# Patient Record
Sex: Female | Born: 1985 | Race: White | Hispanic: Yes | Marital: Single | Smoking: Never smoker
Health system: Southern US, Community
[De-identification: ages and names within clinical notes are randomized; demographics above are authoritative.]

## PROBLEM LIST (undated history)

## (undated) ENCOUNTER — Emergency Department (HOSPITAL_COMMUNITY): Admission: EM | Payer: Self-pay

## (undated) DIAGNOSIS — Z789 Other specified health status: Secondary | ICD-10-CM

## (undated) HISTORY — PX: BREAST CYST EXCISION: SHX579

## (undated) HISTORY — DX: Other specified health status: Z78.9

---

## 2006-09-29 ENCOUNTER — Encounter: Payer: Self-pay | Admitting: Obstetrics and Gynecology

## 2006-09-29 ENCOUNTER — Ambulatory Visit: Payer: Self-pay | Admitting: Obstetrics & Gynecology

## 2006-10-05 ENCOUNTER — Ambulatory Visit (HOSPITAL_COMMUNITY): Admission: RE | Admit: 2006-10-05 | Discharge: 2006-10-05 | Payer: Self-pay | Admitting: Obstetrics and Gynecology

## 2006-10-13 ENCOUNTER — Ambulatory Visit: Payer: Self-pay | Admitting: Obstetrics & Gynecology

## 2006-10-20 ENCOUNTER — Ambulatory Visit: Payer: Self-pay | Admitting: Obstetrics & Gynecology

## 2006-10-25 ENCOUNTER — Ambulatory Visit: Payer: Self-pay | Admitting: Obstetrics & Gynecology

## 2006-10-27 ENCOUNTER — Ambulatory Visit (HOSPITAL_COMMUNITY): Admission: RE | Admit: 2006-10-27 | Discharge: 2006-10-27 | Payer: Self-pay | Admitting: Obstetrics and Gynecology

## 2006-10-27 ENCOUNTER — Ambulatory Visit: Payer: Self-pay | Admitting: *Deleted

## 2006-11-01 ENCOUNTER — Ambulatory Visit: Payer: Self-pay | Admitting: Obstetrics and Gynecology

## 2006-11-03 ENCOUNTER — Ambulatory Visit: Payer: Self-pay | Admitting: *Deleted

## 2006-11-10 ENCOUNTER — Ambulatory Visit: Payer: Self-pay | Admitting: Obstetrics & Gynecology

## 2006-11-11 ENCOUNTER — Ambulatory Visit (HOSPITAL_COMMUNITY): Admission: RE | Admit: 2006-11-11 | Discharge: 2006-11-11 | Payer: Self-pay | Admitting: Family Medicine

## 2006-11-17 ENCOUNTER — Ambulatory Visit: Payer: Self-pay | Admitting: Obstetrics & Gynecology

## 2006-11-24 ENCOUNTER — Ambulatory Visit: Payer: Self-pay | Admitting: Obstetrics & Gynecology

## 2006-12-01 ENCOUNTER — Ambulatory Visit: Payer: Self-pay | Admitting: Obstetrics & Gynecology

## 2006-12-04 ENCOUNTER — Ambulatory Visit: Payer: Self-pay | Admitting: Obstetrics & Gynecology

## 2006-12-04 ENCOUNTER — Inpatient Hospital Stay (HOSPITAL_COMMUNITY): Admission: AD | Admit: 2006-12-04 | Discharge: 2006-12-06 | Payer: Self-pay | Admitting: Obstetrics & Gynecology

## 2008-09-06 IMAGING — US US OB COMP +14 WK
1 series · 14 of 28 positions shown · non-contrast
Comparison: none

OBSTETRICAL ULTRASOUND:

 This ultrasound exam was performed in the [HOSPITAL] Ultrasound Department.  The OB US report was generated in the AS system, and faxed to the ordering physician.  This report is also available in [REDACTED] PACS.

[Series 1: us ob comp +14 wk · 0.26mm/px · 14 of 43 slices shown]
[im 2/43]
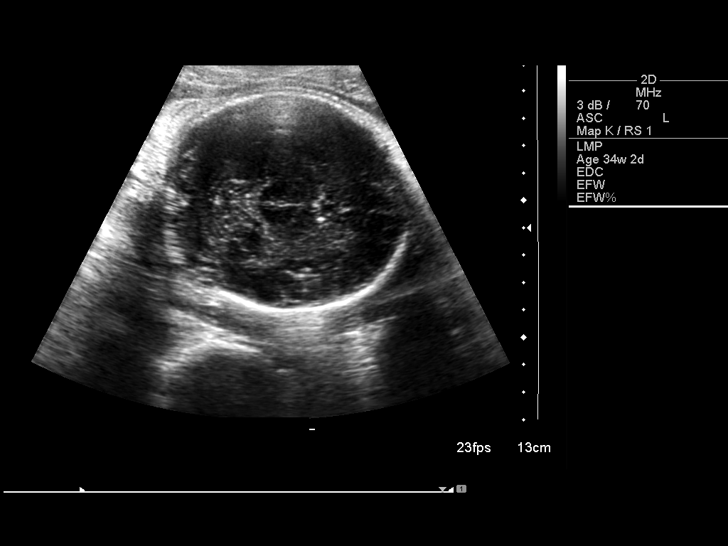
[im 5/43]
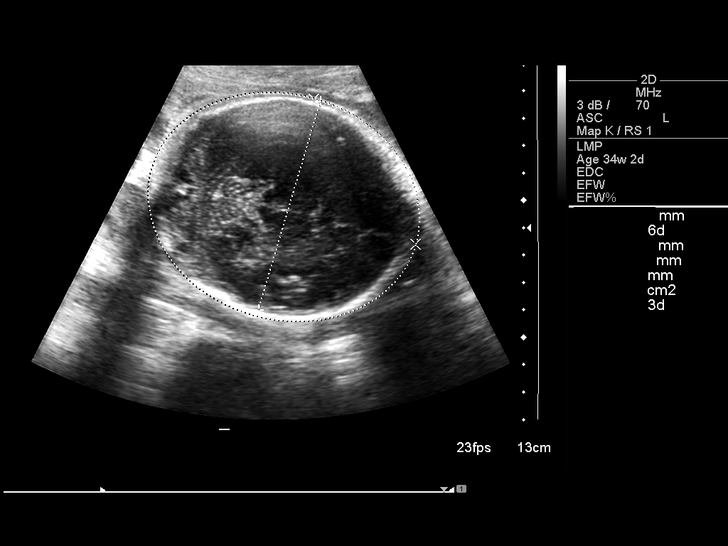
[im 8/43]
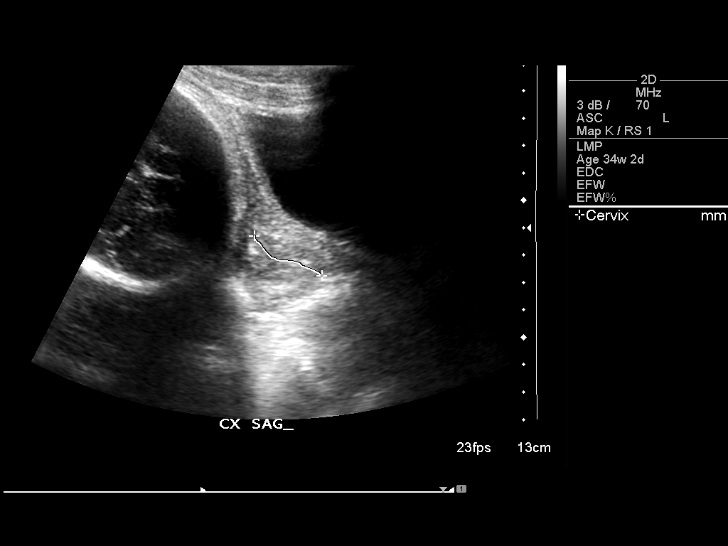
[im 11/43]
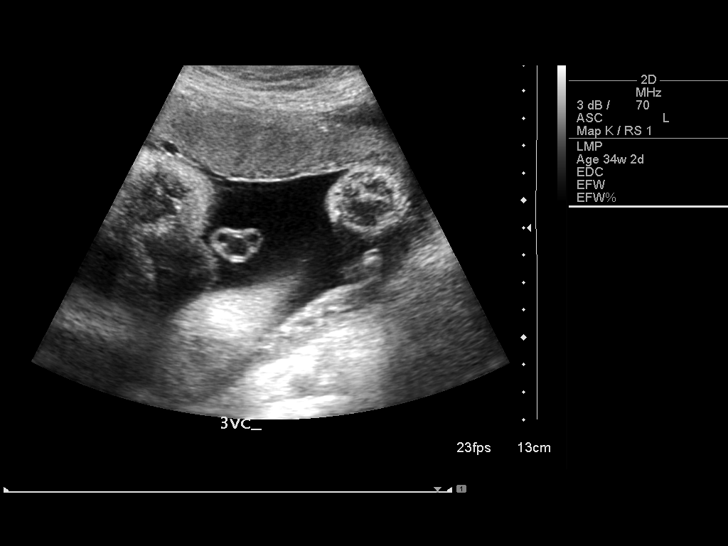
[im 15/43]
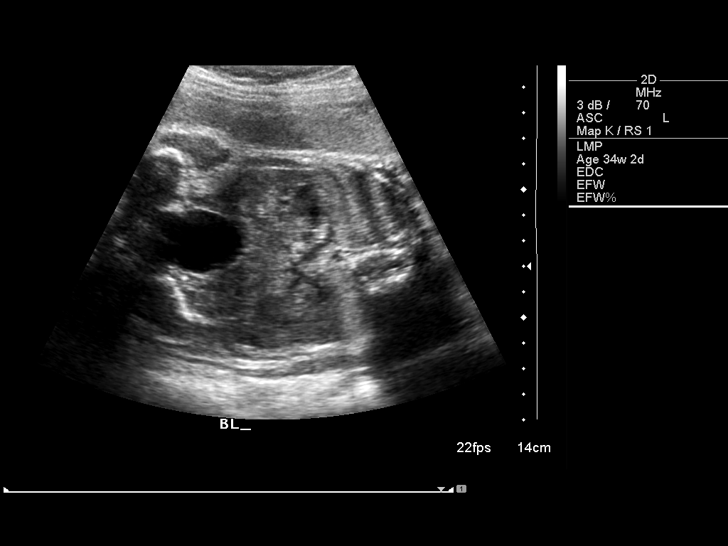
[im 18/43]
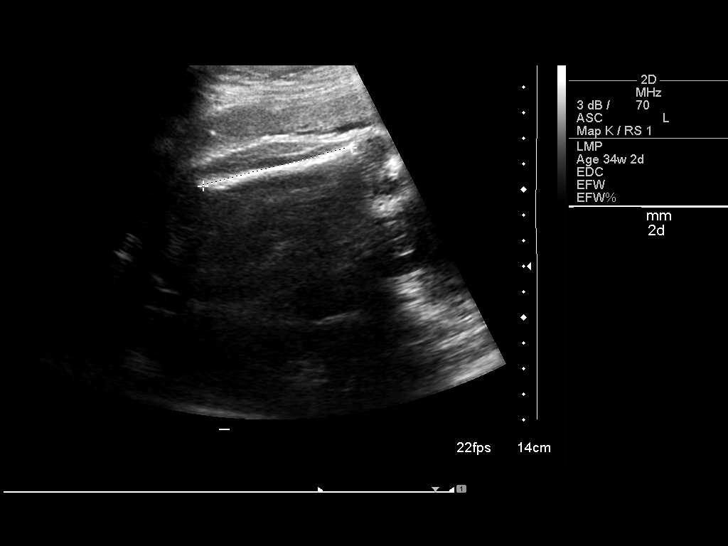
[im 21/43]
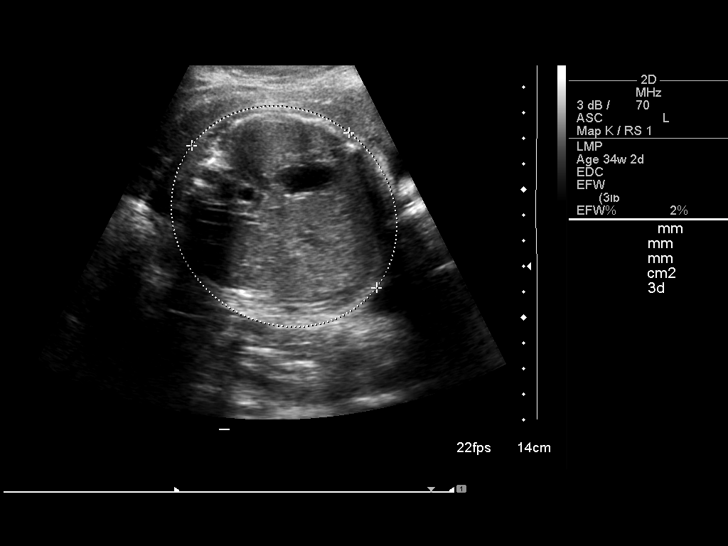
[im 24/43]
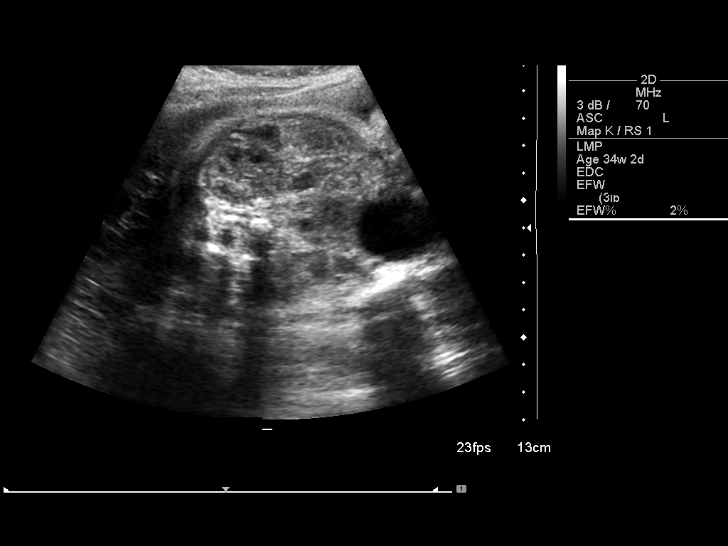
[im 27/43]
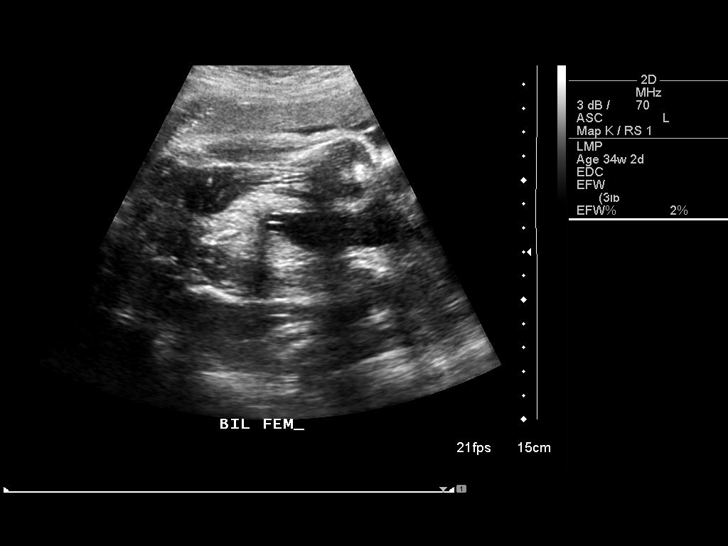
[im 30/43]
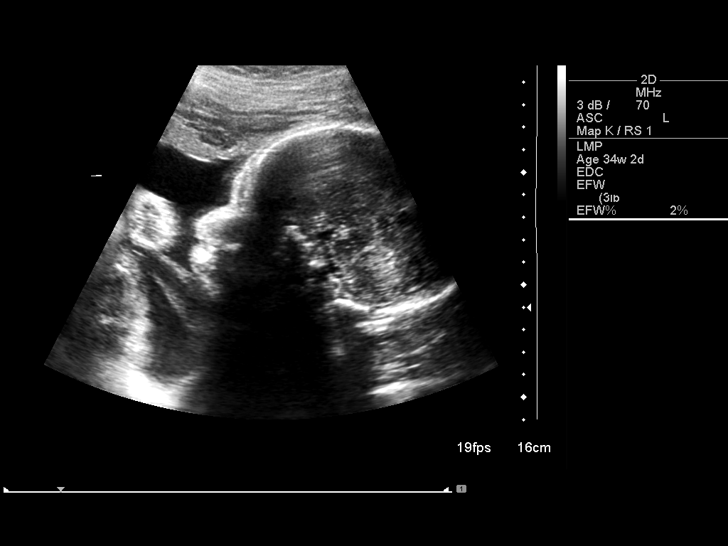
[im 33/43]
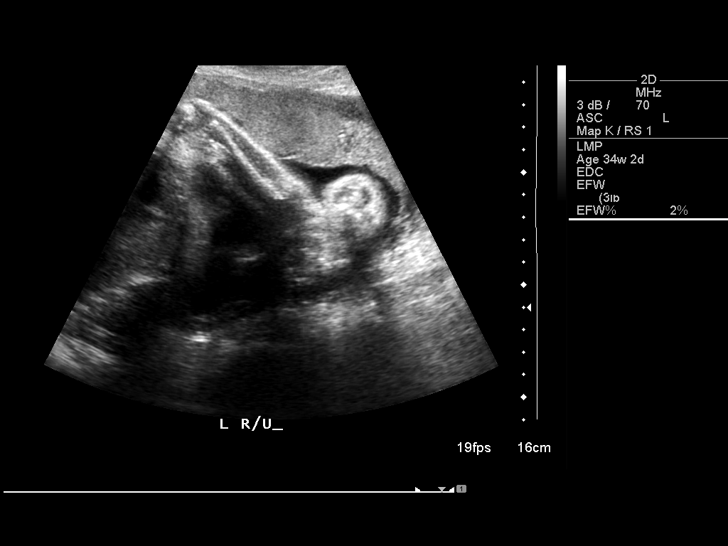
[im 36/43]
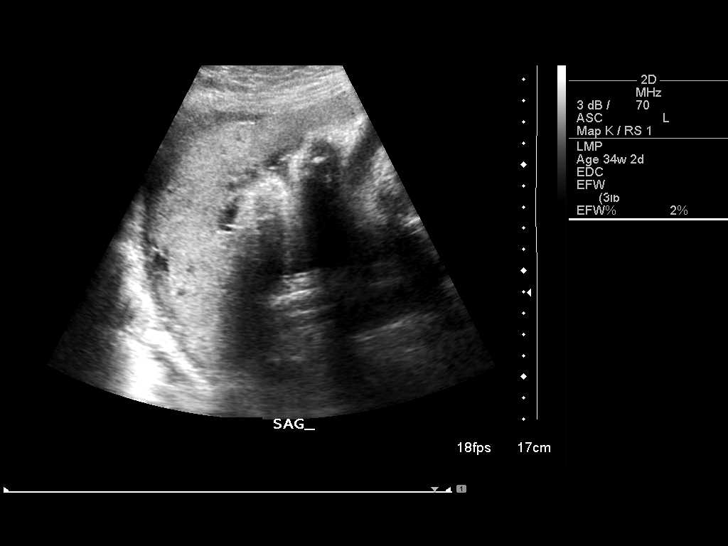
[im 39/43]
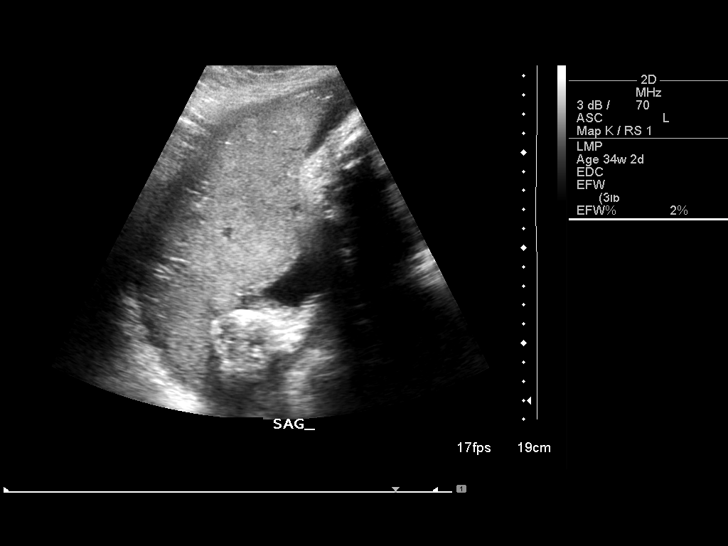
[im 43/43]
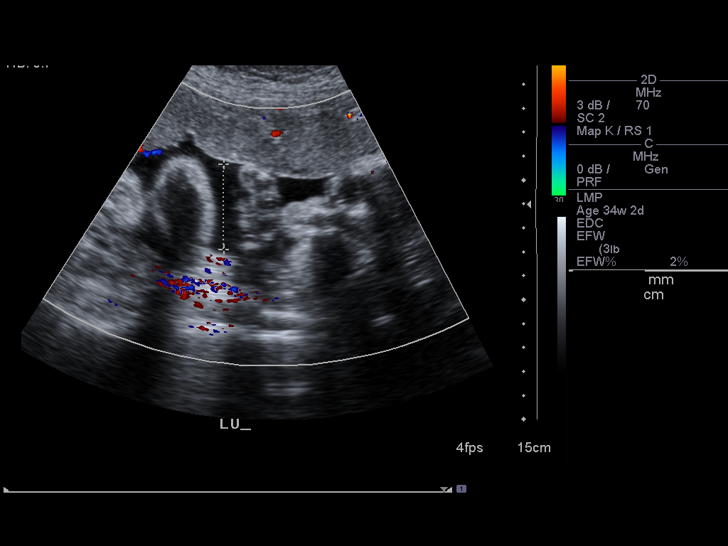

[14 of 28 positions shown; findings below may reference images not displayed]

IMPRESSION: See [facility] Obstetric US report.

## 2011-04-01 LAB — COMPREHENSIVE METABOLIC PANEL
ALT: 20
AST: 22
Albumin: 3.1 — ABNORMAL LOW
Alkaline Phosphatase: 204 — ABNORMAL HIGH
Alkaline Phosphatase: 274 — ABNORMAL HIGH
BUN: 11
CO2: 24
Calcium: 9.2
Chloride: 105
GFR calc Af Amer: >60
GFR calc non Af Amer: >60
Glucose, Bld: 107 — ABNORMAL HIGH
Potassium: 3.8
Potassium: 4
Sodium: 135
Total Protein: 7.1

## 2011-04-01 LAB — POCT URINALYSIS DIP (DEVICE)
Ketones, ur: NEGATIVE
Nitrite: NEGATIVE
Protein, ur: NEGATIVE
pH: 6

## 2011-04-01 LAB — CBC
HCT: 38
Hemoglobin: 10.8 — ABNORMAL LOW
MCHC: 34.2
MCHC: 34.5
Platelets: 188
RBC: 3.56 — ABNORMAL LOW
RDW: 14.9 — ABNORMAL HIGH
WBC: 15 — ABNORMAL HIGH

## 2011-04-01 LAB — RPR: RPR Ser Ql: NONREACTIVE

## 2011-04-01 LAB — LACTATE DEHYDROGENASE: LDH: 110

## 2011-04-01 LAB — URIC ACID: Uric Acid, Serum: 6.6

## 2011-04-02 LAB — POCT URINALYSIS DIP (DEVICE)
Bilirubin Urine: NEGATIVE
Glucose, UA: NEGATIVE
Hgb urine dipstick: NEGATIVE
Hgb urine dipstick: NEGATIVE
Nitrite: NEGATIVE
Nitrite: NEGATIVE
Specific Gravity, Urine: 1.02
Specific Gravity, Urine: 1.02
Urobilinogen, UA: 0.2
Urobilinogen, UA: 0.2
pH: 6.5
pH: 7

## 2015-06-16 NOTE — L&D Delivery Note (Addendum)
Delivery Note Rapidly progressed to complete dilation.  Pushed only a few times with rotation from OP to OA and quick delivery  At 12:47 PM a viable and healthy female was delivered via Vaginal, Spontaneous Delivery (Presentation: OA with compound hand  ).  APGAR: 9, 9; weight  .   Placenta status:spontaneous and grossly intact with 3V Cord:  with the following complications: nuchal cord x 1   Anesthesia:  epidural Episiotomy: None Lacerations: 2nd degree Suture Repair: 3.0 vicryl rapide Est. Blood Loss (mL):  100  Mom to postpartum.  Baby to Couplet care / Skin to Skin.  After delivery, BPs improved. Will not do Mag at this time. Will watch BPs  WILLIAMS,MARIE 05/23/2016, 1:06 PM

## 2016-03-25 ENCOUNTER — Encounter: Payer: Self-pay | Admitting: Family

## 2016-03-25 ENCOUNTER — Ambulatory Visit (INDEPENDENT_AMBULATORY_CARE_PROVIDER_SITE_OTHER): Payer: Self-pay | Admitting: Family

## 2016-03-25 DIAGNOSIS — Z23 Encounter for immunization: Secondary | ICD-10-CM

## 2016-03-25 DIAGNOSIS — Z1151 Encounter for screening for human papillomavirus (HPV): Secondary | ICD-10-CM

## 2016-03-25 DIAGNOSIS — O0990 Supervision of high risk pregnancy, unspecified, unspecified trimester: Secondary | ICD-10-CM | POA: Insufficient documentation

## 2016-03-25 DIAGNOSIS — O0933 Supervision of pregnancy with insufficient antenatal care, third trimester: Secondary | ICD-10-CM

## 2016-03-25 DIAGNOSIS — Z124 Encounter for screening for malignant neoplasm of cervix: Secondary | ICD-10-CM

## 2016-03-25 DIAGNOSIS — Z113 Encounter for screening for infections with a predominantly sexual mode of transmission: Secondary | ICD-10-CM

## 2016-03-25 DIAGNOSIS — O0930 Supervision of pregnancy with insufficient antenatal care, unspecified trimester: Secondary | ICD-10-CM | POA: Insufficient documentation

## 2016-03-25 LAB — POCT URINALYSIS DIP (DEVICE)
Bilirubin Urine: NEGATIVE
Glucose, UA: NEGATIVE mg/dL
HGB URINE DIPSTICK: NEGATIVE
Ketones, ur: NEGATIVE mg/dL
LEUKOCYTES UA: NEGATIVE
NITRITE: NEGATIVE
PH: 7 (ref 5.0–8.0)
Protein, ur: NEGATIVE mg/dL
Specific Gravity, Urine: 1.01 (ref 1.005–1.030)
UROBILINOGEN UA: 0.2 mg/dL (ref 0.0–1.0)

## 2016-03-25 MED ORDER — TETANUS-DIPHTH-ACELL PERTUSSIS 5-2.5-18.5 LF-MCG/0.5 IM SUSP
0.5000 mL | Freq: Once | INTRAMUSCULAR | Status: AC
  Administered 2016-03-25: 0.5 mL via INTRAMUSCULAR

## 2016-03-25 NOTE — Progress Notes (Signed)
  Subjective:    Erin Henry is a G2P1001 6535w6d being seen today for her first obstetrical visit.  Her obstetrical history is significant for history of one term NSVD and current pregnancy late to care. Patient does intend to breast feed. Pregnancy history fully reviewed.  Patient reports no complaints.  Vitals:   03/25/16 0836 03/25/16 0837  BP: 120/82   Pulse: 78   Weight: 168 lb (76.2 kg)   Height:  5' (1.524 m)    HISTORY: OB History  Gravida Para Term Preterm AB Living  2 1 1  0 0 1  SAB TAB Ectopic Multiple Live Births  0 0 0 0 1    # Outcome Date GA Lbr Len/2nd Weight Sex Delivery Anes PTL Lv  2 Current           1 Term 12/04/06 6376w0d  7 lb 8 oz (3.402 kg) M Vag-Spont  N LIV     Past Medical History:  Diagnosis Date  . Medical history non-contributory    Past Surgical History:  Procedure Laterality Date  . BREAST CYST EXCISION     Family History  Problem Relation Age of Onset  . Diabetes Mother      Exam    BP 120/82   Pulse 78   Ht 5' (1.524 m)   Wt 168 lb (76.2 kg)   LMP 08/15/2015 (Exact Date)   BMI 32.81 kg/m  Uterine Size: size equals dates  Pelvic Exam:    Perineum: No Hemorrhoids, Normal Perineum   Vulva: normal   Vagina:  normal mucosa, normal discharge, no palpable nodules   pH: Not done   Cervix: no bleeding following Pap, no cervical motion tenderness and no lesions   Adnexa: normal adnexa and no mass, fullness, tenderness   Bony Pelvis: Adequate  System: Breast:  No nipple retraction or dimpling, No nipple discharge or bleeding, No axillary or supraclavicular adenopathy, Normal to palpation without dominant masses   Skin: normal coloration and turgor, no rashes    Neurologic: negative   Extremities: normal strength, tone, and muscle mass   HEENT neck supple with midline trachea and thyroid without masses   Mouth/Teeth mucous membranes moist, pharynx normal without lesions   Neck supple and no masses   Cardiovascular: regular rate and rhythm, no murmurs or gallops   Respiratory:  appears well, vitals normal, no respiratory distress, acyanotic, normal RR, neck free of mass or lymphadenopathy, chest clear, no wheezing, crepitations, rhonchi, normal symmetric air entry   Abdomen: soft, non-tender; bowel sounds normal; no masses,  no organomegaly   Urinary: urethral meatus normal     Assessment:    Pregnancy: G2P1001 Patient Active Problem List   Diagnosis Date Noted  . Late prenatal care affecting pregnancy, antepartum 03/25/2016  . Supervision of high risk pregnancy, antepartum 03/25/2016        Plan:     Initial labs drawn. Pap smear collected Prenatal vitamins. Problem list reviewed and updated. Genetic Screening:  Late to care  Ultrasound discussed; fetal survey: ordered.  Follow up in 2 weeks.  Marlis EdelsonKARIM, WALIDAH N 03/25/2016

## 2016-03-25 NOTE — Progress Notes (Signed)
Video interpreter [phone] used for check in Patient had [place] 3 years ago  Video interpreter was disconnected 2 times during check in process  1 hr gtt @ 926

## 2016-03-25 NOTE — Patient Instructions (Addendum)
AREA PEDIATRIC/FAMILY PRACTICE PHYSICIANS  [facility] [address] [place], [place]  [address] Phone - [phone]   Fax - [phone]  [facility] [address] [place], [place] [address] Phone - [phone]   Fax - [phone]  JACK AMOS [address] [place], [place]  [address] Phone - [phone]   Fax - [phone]  [facility] [address] [place], [place]  [address] Phone - [phone]   Fax - [phone]  [facility] [address] [place], [place]  [address] Phone - [phone]   Fax - [phone]  [facility] [address] [place], [place]  [address] Phone - [phone]   Fax - [phone]  [facility] [address] [place], [place]  [address] Phone - [phone]   Fax - [phone]  [facility] AT [facility] [address] [place], [address] [place], [place]  [address] Phone - [phone]   Fax - [phone]  [facility] [address] [place], [place]  [address] Phone - [phone]   Fax - [phone] [facility] [place] [address] [place], [place]  [address] Phone - [phone]   Fax - [phone]  [facility] [address] [place], [place]  [address] Phone - [phone]   Fax - [phone]  [facility] [address] [place], [place]  [address] Phone - [phone]   Fax - [phone]  [facility] [address] [place], [place]  [address] Phone - [phone]   Fax - [phone]  [facility] [address], [address] [place], [place]  [address] Phone - [phone]  [facility] [address] [place], [place]  [address] Phone - [phone]   Fax - [phone]  [facility] [address] [place], [place]  [address] Phone - [phone]   Fax - [phone]  [facility] [address] [place], [place]  [address] Phone - [phone]   Fax - [phone]   FAMILY MEDICINE [address] [place], [place]  [address] Phone - [phone]   Fax - [phone]   [facility] [address] [place], [place]  [address] Phone - [phone]   Fax - [phone]  [facility] [address] [place], [place]  [address] Phone - [phone]   Fax - [phone]  DAVID RUBIN [address] [place], [place]  [address] Phone - [phone]   Fax - [phone]  [facility] [address] [place], [place]  [address] Phone - [phone]   Fax - [phone]  [facility] [address] [place], [place]  [address] Phone - [phone]   Fax - [phone] Gerarda Fraction [address] [place], [place]  [address] Phone - [phone]   Fax - [phone]  [facility] [address] [place], [place]  [address] Phone - [phone]   Fax - [phone]  [facility] [place] [address] [place], [place]  [address] Phone - [phone]   Fax - [phone]     Tercer trimestre de Psychiatrist (Third Trimester of Pregnancy) El tercer trimestre comprende desde la semana29 hasta la semana42, es decir, desde el mes7 hasta el [address] El tercer trimestre es un  perodo en el que el feto crece rpidamente. Hacia el final del noveno mes, el feto mide alrededor de 20pulgadas (45cm) de largo y pesa entre 6 y 10 libras (2,700 y 154,500kg).  CAMBIOS EN EL ORGANISMO Su organismo atraviesa por muchos cambios durante el Cut and Shootembarazo, y estos varan de Neomia Dearuna mujer a Educational psychologistotra.   Seguir American Standard Companiesaumentando de peso. Es de esperar que aumente entre 25 y 35libras (11 y 16kg) hacia el final del Psychiatristembarazo.  Podrn  aparecer las primeras Albertson'sestras en las caderas, el abdomen y las Northvalemamas.  Puede tener necesidad de Geographical information systems officerorinar con ms frecuencia porque el feto baja hacia la pelvis y ejerce presin sobre la vejiga.  Debido al Vanetta Muldersembarazo podr sentir Anthoney Haradaacidez estomacal con frecuencia.  Puede estar estreida, ya que ciertas hormonas enlentecen los movimientos de los msculos que New York Life Insuranceempujan los desechos a travs de los intestinos.  Pueden aparecer hemorroides o abultarse e hincharse las venas (venas varicosas).  Puede sentir dolor plvico debido al Con-wayaumento de peso y a que las hormonas del Management consultantembarazo relajan las articulaciones entre los huesos de la pelvis. El dolor de espalda puede ser consecuencia de la sobrecarga de los msculos que soportan la Magaliapostura.  Tal vez haya cambios en el cabello que pueden incluir su engrosamiento, crecimiento rpido y cambios en la textura. Adems, a algunas mujeres se les cae el cabello durante o despus del embarazo, o tienen el cabello seco o fino. Lo ms probable es que el cabello se le normalice despus del nacimiento del beb.  Las ConAgra Foodsmamas seguirn creciendo y Development worker, communityle dolern. A veces, puede haber una secrecin amarilla de las mamas llamada calostro.  El ombligo puede salir hacia afuera.  Puede sentir que le falta el aire debido a que se expande el tero.  Puede notar que el feto "baja" o lo siente ms bajo, en el abdomen.  Puede tener una prdida de secrecin mucosa con sangre. Esto suele ocurrir en el trmino de unos [address]os das a una semana antes de que comience el Waverlytrabajo de Alexandriaparto.  El cuello del tero se vuelve delgado y blando (se borra) cerca de la fecha de Morgan's Point Resortparto. QU DEBE ESPERAR EN LOS EXMENES PRENATALES  Le harn exmenes prenatales cada 2semanas hasta la semana36. A partir de ese momento le harn exmenes semanales. Durante una visita prenatal de rutina:  La pesarn para asegurarse de que usted y el feto estn creciendo normalmente.  Le tomarn la presin arterial.  Le medirn  el abdomen para controlar el desarrollo del beb.  Se escucharn los latidos cardacos fetales.  Se evaluarn los resultados de los estudios solicitados en visitas anteriores.  Le revisarn el cuello del tero cuando est prxima la fecha de parto para controlar si este se ha borrado. Alrededor de la semana36, el mdico le revisar el cuello del tero. Al mismo tiempo, realizar un anlisis de las secreciones del tejido vaginal. Este examen es para determinar si hay un tipo de bacteria, estreptococo Grupo B. El mdico le explicar esto con ms detalle. El mdico puede preguntarle lo siguiente:  Cmo le gustara que fuera el Rainierparto.  Cmo se siente.  Si siente los movimientos del beb.  Si ha tenido sntomas anormales, como prdida de lquido, Pine Hillssangrado, dolores de cabeza intensos o clicos abdominales.  Si est consumiendo algn producto que contenga tabaco, como cigarrillos, tabaco de Theatre managermascar y Administrator, Civil Servicecigarrillos electrnicos.  Si tiene Colgate-Palmolivealguna pregunta. Otros exmenes o estudios de deteccin que pueden realizarse durante el tercer trimestre incluyen lo siguiente:  Anlisis de Boyne Fallssangre  para controlar los niveles de hierro (anemia).  Controles fetales para determinar su salud, nivel de [place] y Designer, jewellery. Si tiene [place] enfermedad o hay problemas durante el embarazo, le harn estudios.  Prueba del VIH (virus de inmunodeficiencia humana). Si corre Chiropodist, pueden realizarle una prueba de deteccin del VIH durante el tercer trimestre del embarazo. FALSO TRABAJO DE PARTO Es posible que sienta contracciones leves e irregulares que finalmente desaparecen. Se llaman contracciones de [address] o falso trabajo de [place]. Las Fifth Third Bancorp pueden durar horas, [address] o incluso semanas, antes de que el verdadero trabajo de parto se inicie. Si las contracciones ocurren a intervalos regulares, se intensifican o se hacen dolorosas, lo mejor es que la revise el mdico.  SIGNOS DE TRABAJO DE PARTO    Clicos de tipo menstrual.  Contracciones cada o menos.  Contracciones que comienzan en la parte superior del tero y se extienden hacia abajo, a la zona inferior del abdomen y la espalda.  Sensacin de mayor presin en la pelvis o dolor de espalda.  Una secrecin de mucosidad acuosa o con sangre que sale de la vagina. Si tiene alguno de estos signos antes de la semana37 del Psychiatrist, llame a su mdico de inmediato. Debe concurrir al hospital para que la controlen inmediatamente. INSTRUCCIONES PARA EL CUIDADO EN EL HOGAR   Evite fumar, consumir hierbas, beber alcohol y tomar frmacos que no le hayan recetado. Estas sustancias qumicas afectan la formacin y el desarrollo del beb.  No consuma ningn producto que contenga tabaco, lo que incluye cigarrillos, tabaco de Theatre manager y Administrator, Civil Service. Si necesita ayuda para dejar de fumar, consulte al American Express. Puede recibir asesoramiento y otro tipo de recursos para dejar de fumar.  Siga las indicaciones del mdico en relacin con el uso de medicamentos. Durante el embarazo, hay medicamentos que son seguros de tomar y otros que no.  Haga ejercicio solamente como se lo haya indicado el mdico. Sentir clicos uterinos es un buen signo para Restaurant manager, fast food actividad fsica.  Contine comiendo alimentos sanos con regularidad.  Use un sostn que le brinde buen soporte si le Altria Group.  No se d baos de inmersin en agua caliente, baos turcos ni saunas.  Use el cinturn de seguridad en todo momento mientras conduce.  No coma carne cruda ni queso sin cocinar; evite el contacto con las bandejas sanitarias de los gatos y la tierra que estos animales usan. Estos elementos contienen grmenes que pueden causar defectos congnitos en el beb.  Tome las vitaminas prenatales.  Tome entre 1500 y 2000mg  de calcio diariamente comenzando en la semana20 del embarazo [place].  Si est estreida, pruebe un laxante suave (si el  mdico lo autoriza). Consuma ms alimentos ricos en fibra, como vegetales y frutas frescos y Radiation protection practitioner. Beba gran cantidad de lquido para mantener la orina de tono claro o color amarillo plido.  Dese baos de asiento con agua tibia para Engineer, materials o las molestias causadas por las hemorroides. Use una crema para las hemorroides si el mdico la autoriza.  Si tiene venas varicosas, use medias de descanso. Eleve los pies durante , 3 o 4veces por da. Limite el consumo de sal en su dieta.  Evite levantar objetos pesados, use zapatos de tacones bajos y [place].  Descanse con las piernas elevadas si tiene calambres o dolor de cintura.  Visite a su dentista si no lo ha Occupational hygienist. Use un cepillo de dientes blando  para higienizarse los dientes y psese el hilo dental con suavidad.  Puede seguir Calpine Corporation, a menos que el mdico le indique lo contrario.  No haga viajes largos excepto que sea absolutamente necesario y solo con la autorizacin del mdico.  Tome clases prenatales para Financial trader, Education administrator y hacer preguntas sobre el Monarch de parto y [place].  Haga un ensayo de la partida al hospital.  Prepare el bolso que llevar al hospital.  Prepare la habitacin del beb.  Concurra a todas las visitas prenatales segn las indicaciones de su mdico. SOLICITE ATENCIN MDICA SI:  No est segura de que est en trabajo de parto o de que ha roto la bolsa de las aguas.  Tiene mareos.  Siente clicos leves, presin en la pelvis o dolor persistente en el abdomen.  Tiene nuseas, vmitos o diarrea persistentes.  Brett Fairy secrecin vaginal con mal olor.  Siente dolor al ConocoPhillips. SOLICITE ATENCIN MDICA DE INMEDIATO SI:   Tiene fiebre.  Tiene una prdida de lquido por la vagina.  Tiene sangrado o pequeas prdidas vaginales.  Siente dolor intenso o clicos en el abdomen.  Sube o baja de peso  rpidamente.  Tiene dificultad para respirar y siente dolor de pecho.  Sbitamente se le hinchan mucho el rostro, las [place], los tobillos, los pies o las piernas.  No ha sentido los movimientos del beb durante Georgianne Fick.  Siente un dolor de cabeza intenso que no se alivia con medicamentos.  Su visin se modifica.   Esta informacin no tiene Theme park manager el consejo del mdico. Asegrese de hacerle al mdico cualquier pregunta que tenga.   Document Released: 03/11/2005 Document Revised: 06/22/2014 Elsevier Interactive Patient Education 2016 Arvin[employer]itor.   [place] materna (Breastfeeding) Decidir Museum/gallery exhibitions officer es una de las mejores elecciones que puede hacer por usted y su beb. El cambio hormonal durante el Psychiatrist produce el desarrollo del tejido mamario y [place] la cantidad y el tamao de los conductos galactforos. Estas hormonas tambin permiten que las protenas, los azcares y las grasas de la sangre produzcan la W[employer] Resources materna en las glndulas productoras de [place]. Las hormonas impiden que la leche materna sea liberada antes del nacimiento del beb, adems de impulsar el flujo de leche luego del nacimiento. Una vez que ha comenzado a Museum/gallery exhibitions officer, Conservation officer, nature beb, as Immunologist succin o Theatre manager, pueden estimular la liberacin de [place] de las glndulas productoras de [place].  LOS BENEFICIOS DE AMAMANTAR Para el beb  La primera leche (calostro) ayuda a Careers information officer funcionamiento del sistema digestivo del beb.  La leche tiene anticuerpos que ayudan a Radio producer las infecciones en el beb.  El beb tiene una menor incidencia de asma, alergias y del sndrome de muerte sbita del lactante.  Los nutrientes en la [place] materna son mejores para el beb que la Richards maternizada y estn preparados exclusivamente para cubrir las necesidades del beb.  La leche materna mejora el desarrollo cerebral del beb.  Es menos probable que el beb desarrolle otras enfermedades, como obesidad  infantil, asma o diabetes mellitus de tipo 2. Para usted   La lactancia materna favorece el desarrollo de un vnculo muy especial entre la madre y el beb.  Es conveniente. La leche materna siempre est disponible a la Human resources officer y es [place].  La lactancia materna ayuda a quemar caloras y a perder el peso ganado durante el [place].  Favorece la contraccin del tero al tamao que tena antes del embarazo de manera ms  rpida y Consolidated Edison sangrado (loquios) despus del parto.  La lactancia materna contribuye a reducir Nurse, adult de desarrollar diabetes mellitus de tipo 2, osteoporosis o cncer de mama o de ovario en el futuro. SIGNOS DE QUE EL BEB EST HAMBRIENTO Primeros signos de [address] de [place].  Se estira.  Mueve la cabeza de un lado a otro.  Mueve la cabeza y abre la boca cuando se le toca la mejilla o la comisura de la boca (reflejo de bsqueda).  Aumenta las vocalizaciones, tales como sonidos de succin, se relame los labios, emite arrullos, suspiros, o chirridos.  Mueve la Jones Apparel Group boca.  Se chupa con ganas los dedos o las manos. Signos tardos de Fisher Scientific.  Llora de manera intermitente. Signos de AES Corporation signos de hambre extrema requerirn que lo calme y lo consuele antes de que el beb pueda alimentarse adecuadamente. No espere a que se manifiesten los siguientes signos de hambre extrema para comenzar a Museum/gallery exhibitions officer:   Designer, jewellery.  Llanto intenso y fuerte.  Gritos. INFORMACIN BSICA SOBRE LA LACTANCIA MATERNA Iniciacin de la lactancia materna  Encuentre un lugar cmodo para sentarse o acostarse, con un buen respaldo para el cuello y la espalda.  Coloque una almohada o una manta enrollada debajo del beb para acomodarlo a la altura de la mama (si est sentada). Las almohadas para Museum/gallery exhibitions officer se han diseado especialmente a fin de servir de apoyo para los brazos y el beb Target Corporation.  Asegrese de que el abdomen del beb est frente al suyo.   Masajee suavemente la mama. Con las yemas de los dedos, masajee la pared del pecho hacia el pezn en un movimiento circular. Esto estimula el flujo de [place]. Es posible que Engineer, manufacturing systems este movimiento mientras amamanta si la leche fluye lentamente.  Sostenga la mama con el pulgar por arriba del pezn y los otros 4 dedos por debajo de la mama. Asegrese de que los dedos se encuentren lejos del pezn y de la boca del beb.  Empuje suavemente los labios del beb con el pezn o con el dedo.  Cuando la boca del beb se abra lo suficiente, acrquelo rpidamente a la mama e introduzca todo el pezn y la zona oscura que lo rodea (areola), tanto como sea posible, dentro de la boca del beb.  Debe haber ms areola visible por arriba del labio superior del beb que por debajo del labio inferior.  La lengua del beb debe estar entre la enca inferior y la [place].  Asegrese de que la boca del beb est en la posicin correcta alrededor del pezn (prendida). Los labios del beb deben crear un sello sobre la mama y estar doblados hacia afuera (invertidos).  Es comn que el beb succione durante 2 a 3 minutos para que comience el flujo de [place]. Cmo debe prenderse Es muy importante que le ensee al beb cmo prenderse adecuadamente a la mama. Si el beb no se prende adecuadamente, puede causarle dolor en el pezn y reducir la produccin de [place], y hacer que el beb tenga un escaso aumento de [place]. Adems, si el beb no se prende adecuadamente al pezn, puede tragar aire durante la alimentacin. Esto puede causarle molestias al beb. Hacer eructar al beb al Pilar Plate de mama puede ayudarlo a liberar el aire. Sin embargo, ensearle al beb cmo prenderse a la mama adecuadamente es la mejor manera de evitar que se sienta molesto por  tragar Aretta Nip se alimenta. Signos de que el beb se ha prendido adecuadamente al pezn:    Payton Doughty o succiona de modo silencioso, sin causarle dolor.  Se escucha que traga cada 3 o 4 succiones.  Hay movimientos musculares por arriba y por delante de sus odos al Printmaker. Signos de que el beb no se ha prendido Audiological scientist al pezn:   Hace ruidos de succin o de chasquido mientras se alimenta.  Siente dolor en el pezn. Si cree que el beb no se prendi correctamente, deslice el dedo en la comisura de la boca y Ameren Corporation las encas del beb para interrumpir la succin. Intente comenzar a amamantar nuevamente. Signos de Fish farm manager Signos del beb:   Disminuye gradualmente el nmero de succiones o cesa la succin por completo.  Se duerme.  Relaja el cuerpo.  Retiene una pequea cantidad de [facility] boca.  Se desprende solo del pecho. Signos que presenta usted:  Las mamas han aumentado la firmeza, el peso y el tamao 1 a 3 horas despus de Museum/gallery exhibitions officer.  Estn ms blandas inmediatamente despus de amamantar.  Un aumento del volumen de [place], y tambin un cambio en su consistencia y color se producen hacia el quinto da de Tour manager.  Los pezones no duelen, ni estn agrietados ni sangran. Signos de que su beb recibe la cantidad de leche suficiente  Moja al menos 3 paales en 24 horas. La orina debe ser clara y de color amarillo plido a los 5 [address] de [place].  Defeca al menos 3 veces en 24 horas a los 5 [address] de [address] La materia fecal debe ser blanda y [place].  Defeca al menos 3 veces en 24 horas a los [address] de [address] La materia fecal debe ser grumosa y [place].  No registra una prdida de peso mayor del 10% del peso al nacer durante los primeros 3 [address] de [place].  Aumenta de peso un promedio de 4 a 7onzas (113 a 198g) por semana despus de los 4 [address] de vida.  Aumenta de [place], [place], de [place] uniforme a Glass blower/designer de los 5 [address] de vida, sin Passenger transport manager prdida de peso despus de las 2semanas de vida. Despus de  alimentarse, es posible que el beb regurgite una pequea cantidad. Esto es frecuente. FRECUENCIA Y DURACIN DE LA LACTANCIA MATERNA El amamantamiento frecuente la ayudar a producir ms [place] y a Education officer, community de Engineer, mining en los pezones e hinchazn en las [place]. Alimente al beb cuando muestre signos de hambre o si siente la necesidad de reducir la congestin de las [place]. Esto se denomina "lactancia a demanda". Evite el uso del chupete mientras trabaja para establecer la lactancia (las primeras 4 a 6 semanas despus del nacimiento del beb). Despus de este perodo, podr ofrecerle un chupete. Las investigaciones demostraron que el uso del chupete durante el primer ao de vida del beb disminuye el riesgo de desarrollar el sndrome de muerte sbita del lactante (SMSL). Permita que el nio se alimente en cada mama todo lo que desee. Contine amamantando al beb hasta que haya terminado de alimentarse. Cuando el beb se desprende o se queda dormido mientras se est alimentando de la primera mama, ofrzcale la segunda. Debido a que, con frecuencia, los recin [place] las primeras semanas de vida, es posible que deba despertar al beb para alimentarlo. Los horarios de Acupuncturist de un beb a otro. Sin embargo, las siguientes reglas pueden servir como gua para ayudarla a Lawyer  que el beb se alimenta adecuadamente:  Se puede amamantar a los recin nacidos (bebs de 4 semanas o menos de vida) cada 1 a 3 horas.  No deben transcurrir ms de 3 horas durante el da o 5 horas durante la noche sin que se amamante a los recin nacidos.  Debe amamantar al beb 8 veces como mnimo en un perodo de 24 horas, hasta que comience a introducir slidos en su dieta, a los 6 meses de vida aproximadamente. EXTRACCIN DE Dean Foods Company MATERNA La extraccin y Contractor de la leche materna le permiten asegurarse de que el beb se alimente exclusivamente de [place], aun en momentos en  los que no puede amamantar. Esto tiene especial importancia si debe regresar al Aleen Campi en el perodo en que an est amamantando o si no puede estar presente en los momentos en que el beb debe alimentarse. Su asesor en lactancia puede orientarla sobre cunto tiempo es seguro almacenar [place].  El sacaleche es un aparato que le permite extraer leche de la mama a un recipiente estril. Luego, la leche materna extrada puede almacenarse en un refrigerador o Electrical engineer. Algunos sacaleches son Birdie Riddle, Delaney Meigs otros son elctricos. Consulte a su asesor en lactancia qu tipo ser ms conveniente para usted. Los sacaleches se pueden comprar; sin embargo, algunos hospitales y grupos de apoyo a la lactancia materna alquilan Sports coach. Un asesor en lactancia puede ensearle cmo extraer W. R. [employer]kley, en caso de que prefiera no usar un sacaleche.  CMO CUIDAR LAS MAMAS DURANTE LA LACTANCIA MATERNA Los pezones se secan, agrietan y duelen durante la Tour manager. Las siguientes recomendaciones pueden ayudarla a Pharmacologist las TEPPCO Partners y sanas:  Careers information officer usar jabn en los pezones.  Use un sostn de soporte. Aunque no son esenciales, las camisetas sin mangas o los sostenes especiales para Museum/gallery exhibitions officer estn diseados para acceder fcilmente a las mamas, para Museum/gallery exhibitions officer sin tener que quitarse todo el sostn o la camiseta. Evite usar sostenes con aro o sostenes muy ajustados.  Seque al aire sus pezones durante 3 a despus de amamantar al beb.  Utilice solo apsitos de Haematologist sostn para Environmental health practitioner las prdidas de [place]. La prdida de un poco de Public Service Enterprise Group tomas es normal.  Utilice lanolina sobre los pezones luego de Museum/gallery exhibitions officer. La lanolina ayuda a mantener la humedad normal de la piel. Si [place] lanolina pura, no tiene que lavarse los pezones antes de volver a Corporate treasurer al beb. La lanolina pura no es txica para el beb. Adems, puede extraer  [employer] algunas gotas de [place] materna y Engineer, maintenance (IT) suavemente esa [employer], para que la [place] se seque al aire. Durante las primeras semanas despus de dar a luz, algunas mujeres pueden experimentar hinchazn en las mamas (congestin [place]). La congestin puede hacer que sienta las mamas pesadas, calientes y sensibles al tacto. El pico de la congestin ocurre dentro de los 3 a 5 das despus del [place]. Las siguientes recomendaciones pueden ayudarla a Paramedic la congestin:  Vace por completo las mamas al [employer] o Environmental health practitioner. Puede aplicar calor hmedo en las mamas (en la ducha o con toallas hmedas para manos) antes de Museum/gallery exhibitions officer o extraer [employer]. Esto aumenta la circulacin y [place] a que la [place]. Si el beb no vaca por completo las [address] cuando lo [address], extraiga la [place] restante despus de que haya finalizado.  Use un sostn ajustado (para amamantar o comn) o una camiseta sin mangas durante 1 o  2 das para indicar al cuerpo que disminuya ligeramente la produccin de [place].  Aplique compresas de hielo [employer], a menos que le resulte demasiado incmodo.  Asegrese de que el beb est prendido y se encuentre en la posicin correcta mientras lo alimenta. Si la congestin persiste luego de 48 horas o despus de seguir estas recomendaciones, comunquese con su mdico o un Holiday representative. RECOMENDACIONES GENERALES PARA EL CUIDADO DE LA SALUD DURANTE LA LACTANCIA MATERNA  Consuma alimentos saludables. Alterne comidas y colaciones, y coma 3 de cada una por da. Dado que lo que come [employer], es posible que algunas comidas hagan que su beb se vuelva ms irritable de lo habitual. Evite comer este tipo de alimentos si percibe que afectan de manera negativa al beb.  Beba leche, jugos de fruta y agua para Patent examiner su sed (aproximadamente 10 vasos al Futures trader).  Descanse con frecuencia, reljese y tome sus vitaminas prenatales para evitar la  fatiga, el estrs y la anemia.  Contine con los autocontroles de la mama.  Evite Product manager y fumar tabaco. Las sustancias qumicas de los cigarrillos que pasan a la leche materna y la exposicin al humo ambiental del tabaco pueden daar al beb.  No consuma alcohol ni drogas, incluida la marihuana. Algunos medicamentos, que pueden ser perjudiciales para el beb, pueden pasar a travs de la [employer]. Es importante que consulte a su mdico antes de Medical sales representative, incluidos todos los medicamentos recetados y de [place], as como los suplementos vitamnicos y herbales. Puede quedar embarazada durante la lactancia. Si desea controlar la natalidad, consulte a su mdico cules son las opciones ms seguras para el beb. SOLICITE ATENCIN MDICA SI:   Usted siente que quiere dejar de Museum/gallery exhibitions officer o se siente frustrada con la lactancia.  Siente dolor en las mamas o en los pezones.  Sus pezones estn agrietados o Water quality scientist.  Sus pechos estn irritados, sensibles o calientes.  Tiene un rea hinchada en cualquiera de las mamas.  Siente escalofros o fiebre.  Tiene nuseas o vmitos.  Presenta una secrecin de otro lquido distinto de la leche materna de los pezones.  Sus mamas no se llenan antes de Museum/gallery exhibitions officer al beb para el quinto da despus del [place].  Se siente triste y deprimida.  El beb est demasiado somnoliento como para comer bien.  El beb tiene problemas para dormir.  Moja menos de 3 paales en 24 horas.  Defeca menos de 3 veces en 24 horas.  La piel del beb o la parte blanca de los ojos se vuelven amarillentas.  El beb no ha aumentado de [place] a los [address] de [place]. SOLICITE ATENCIN MDICA DE INMEDIATO SI:   El beb est muy cansado Retail buyer) y no se quiere despertar para comer.  Le sube la fiebre sin causa.   Esta informacin no tiene Theme park manager el consejo del mdico. Asegrese de hacerle al mdico cualquier pregunta que tenga.   Document  Released: 06/01/2005 Document Revised: 02/20/2015 Elsevier Interactive Patient Education [employer]

## 2016-03-26 LAB — PAIN MGMT, PROFILE 6 CONF W/O MM, U
6 Acetylmorphine: NEGATIVE ng/mL (ref <10)
AMPHETAMINES: NEGATIVE ng/mL (ref <500)
Alcohol Metabolites: NEGATIVE ng/mL (ref <500)
BARBITURATES: NEGATIVE ng/mL (ref <300)
Benzodiazepines: NEGATIVE ng/mL (ref <100)
COCAINE METABOLITE: NEGATIVE ng/mL (ref <150)
Creatinine: 20.7 mg/dL (ref >=20.0)
MARIJUANA METABOLITE: NEGATIVE ng/mL (ref <20)
Methadone Metabolite: NEGATIVE ng/mL (ref <100)
OPIATES: NEGATIVE ng/mL (ref <100)
OXYCODONE: NEGATIVE ng/mL (ref <100)
Oxidant: NEGATIVE ug/mL (ref <200)
PLEASE NOTE: 0
Phencyclidine: NEGATIVE ng/mL (ref <25)
pH: 6.95 (ref 4.5–9.0)

## 2016-03-26 LAB — PRENATAL PROFILE (SOLSTAS)
ANTIBODY SCREEN: NEGATIVE
BASOS PCT: 0 %
Basophils Absolute: 0 cells/uL (ref 0–200)
EOS PCT: 1 %
Eosinophils Absolute: 83 cells/uL (ref 15–500)
HEMATOCRIT: 35.3 % (ref 35.0–45.0)
HEMOGLOBIN: 11.9 g/dL (ref 11.7–15.5)
HIV 1&2 Ab, 4th Generation: NONREACTIVE
Hepatitis B Surface Ag: NEGATIVE
LYMPHS PCT: 21 %
Lymphs Abs: 1743 cells/uL (ref 850–3900)
MCH: 29.2 pg (ref 27.0–33.0)
MCHC: 33.7 g/dL (ref 32.0–36.0)
MCV: 86.7 fL (ref 80.0–100.0)
MONOS PCT: 7 %
MPV: 10.1 fL (ref 7.5–12.5)
Monocytes Absolute: 581 cells/uL (ref 200–950)
Neutro Abs: 5893 cells/uL (ref 1500–7800)
Neutrophils Relative %: 71 %
PLATELETS: 208 10*3/uL (ref 140–400)
RBC: 4.07 MIL/uL (ref 3.80–5.10)
RDW: 13.1 % (ref 11.0–15.0)
RH TYPE: POSITIVE
Rubella: 19.1 Index — ABNORMAL HIGH (ref <0.90)
WBC: 8.3 10*3/uL (ref 3.8–10.8)

## 2016-03-26 LAB — CYTOLOGY - PAP

## 2016-03-26 LAB — CULTURE, OB URINE

## 2016-03-26 LAB — GC/CHLAMYDIA PROBE AMP (~~LOC~~) NOT AT ARMC
Chlamydia: NEGATIVE
Neisseria Gonorrhea: NEGATIVE

## 2016-03-26 LAB — GLUCOSE TOLERANCE, 1 HOUR (50G) W/O FASTING: Glucose, 1 Hr, gestational: 136 mg/dL (ref <140)

## 2016-03-27 ENCOUNTER — Telehealth: Payer: Self-pay | Admitting: General Practice

## 2016-03-27 LAB — HEMOGLOBINOPATHY EVALUATION
HCT: 35.3 % (ref 35.0–45.0)
HGB A2 QUANT: 2.7 % (ref 1.8–3.5)
Hemoglobin: 11.9 g/dL (ref 11.7–15.5)
Hgb A: 96.3 % (ref >96.0)
MCH: 29.2 pg (ref 27.0–33.0)
MCV: 86.7 fL (ref 80.0–100.0)
RBC: 4.07 MIL/uL (ref 3.80–5.10)
RDW: 13.1 % (ref 11.0–15.0)

## 2016-03-27 NOTE — Telephone Encounter (Signed)
Called patient with pacific interpreter # [phone] and informed her of 1 hr gtt results & explained 3 hr gtt process. Patient verbalized understanding and states she has limited transportation and wants to know if she can wait until her next appt here. Told patient no because we need it sooner than that. Patient states [facility] be [facility] next Wednesday [facility]. Patient had no questions

## 2016-03-30 ENCOUNTER — Other Ambulatory Visit: Payer: Self-pay

## 2016-03-30 DIAGNOSIS — R7309 Other abnormal glucose: Secondary | ICD-10-CM

## 2016-03-31 LAB — GLUCOSE TOLERANCE, 3 HOURS
GLUCOSE, 1 HOUR-GESTATIONAL: 151 mg/dL (ref <190)
GLUCOSE, 2 HOUR-GESTATIONAL: 131 mg/dL (ref <165)
GLUCOSE, FASTING-GESTATIONAL: 82 mg/dL (ref 65–104)
Glucose, GTT - 3 Hour: 113 mg/dL (ref <145)

## 2016-04-16 ENCOUNTER — Ambulatory Visit (INDEPENDENT_AMBULATORY_CARE_PROVIDER_SITE_OTHER): Payer: Self-pay | Admitting: Family Medicine

## 2016-04-16 VITALS — BP 131/87 | HR 79 | Wt 171.4 lb

## 2016-04-16 DIAGNOSIS — O0990 Supervision of high risk pregnancy, unspecified, unspecified trimester: Secondary | ICD-10-CM

## 2016-04-16 DIAGNOSIS — O0930 Supervision of pregnancy with insufficient antenatal care, unspecified trimester: Secondary | ICD-10-CM

## 2016-04-16 DIAGNOSIS — O0933 Supervision of pregnancy with insufficient antenatal care, third trimester: Secondary | ICD-10-CM

## 2016-04-16 DIAGNOSIS — Z113 Encounter for screening for infections with a predominantly sexual mode of transmission: Secondary | ICD-10-CM

## 2016-04-16 NOTE — Addendum Note (Signed)
Addended by: Faythe CasaBELLAMY, Shayanne Gomm M on: 04/16/2016 08:44 AM   Modules accepted: Orders

## 2016-04-16 NOTE — Progress Notes (Addendum)
   PRENATAL VISIT NOTE  Subjective:  Erin Henry is a 30 y.o. G2P1001 at 3668w0d being seen today for ongoing prenatal care.  She is currently monitored for the following issues for this high-risk pregnancy and has Late prenatal care affecting pregnancy, antepartum and Supervision of high risk pregnancy, antepartum on her problem list.  Patient reports occasional contractions.  Contractions: Irregular. Vag. Bleeding: None.  Movement: Present. Denies leaking of fluid.   The following portions of the patient's history were reviewed and updated as appropriate: allergies, current medications, past family history, past medical history, past social history, past surgical history and problem list. Problem list updated.  Objective:   Vitals:   04/16/16 0802  BP: 131/87  Pulse: 79  Weight: 171 lb 6.4 oz (77.7 kg)    Fetal Status: Fetal Heart Rate (bpm): 153   Movement: Present     General:  Alert, oriented and cooperative. Patient is in no acute distress.  Skin: Skin is warm and dry. No rash noted.   Cardiovascular: Normal heart rate noted  Respiratory: Normal respiratory effort, no problems with respiration noted  Abdomen: Soft, gravid, appropriate for gestational age. Pain/Pressure: Present     Pelvic:  Cervical exam performed        Extremities: Normal range of motion.  Edema: None  Mental Status: Normal mood and affect. Normal behavior. Normal judgment and thought content.   Assessment and Plan:  Pregnancy: G2P1001 at 1868w0d  1. Supervision of high risk pregnancy, antepartum US done on 10/12 at [facility]. Normal anatomy scan. FHT normal. FH normal. Cultures today.  2. Late prenatal care affecting pregnancy, antepartum   Preterm labor symptoms and general obstetric precautions including but not limited to vaginal bleeding, contractions, leaking of fluid and fetal movement were reviewed in detail with the patient. Please refer to After Visit Summary for other counseling  recommendations.  Return in about 1 week (around 04/23/2016) for OB f/u.  Levie HeritageJacob J Oney Folz, DO

## 2016-04-17 LAB — GC/CHLAMYDIA PROBE AMP (~~LOC~~) NOT AT ARMC
CHLAMYDIA, DNA PROBE: NEGATIVE
NEISSERIA GONORRHEA: NEGATIVE

## 2016-04-18 LAB — CULTURE, BETA STREP (GROUP B ONLY)

## 2016-04-18 LAB — OB RESULTS CONSOLE GBS: GBS: NEGATIVE

## 2016-04-29 ENCOUNTER — Ambulatory Visit (INDEPENDENT_AMBULATORY_CARE_PROVIDER_SITE_OTHER): Payer: Self-pay | Admitting: Family Medicine

## 2016-04-29 VITALS — BP 136/78 | HR 80 | Wt 175.5 lb

## 2016-04-29 DIAGNOSIS — O0990 Supervision of high risk pregnancy, unspecified, unspecified trimester: Secondary | ICD-10-CM

## 2016-04-29 DIAGNOSIS — O0993 Supervision of high risk pregnancy, unspecified, third trimester: Secondary | ICD-10-CM

## 2016-04-29 NOTE — Progress Notes (Signed)
Used Spanish interpreter [employer] InsuranceErick [phone]

## 2016-04-29 NOTE — Progress Notes (Signed)
   PRENATAL VISIT NOTE  Subjective:  Erin Henry is a 30 y.o. G2P1001 at 5775w6d being seen today for ongoing prenatal care.  She is currently monitored for the following issues for this low-risk pregnancy and has Late prenatal care affecting pregnancy, antepartum and Supervision of high risk pregnancy, antepartum on her problem list.  Patient reports no complaints.  Contractions: Irregular. Vag. Bleeding: None.  Movement: Present. Denies leaking of fluid.   The following portions of the patient's history were reviewed and updated as appropriate: allergies, current medications, past family history, past medical history, past social history, past surgical history and problem list. Problem list updated.  Objective:   Vitals:   04/29/16 1245  BP: 136/78  Pulse: 80  Weight: 175 lb 8 oz (79.6 kg)    Fetal Status: Fetal Heart Rate (bpm): 146   Movement: Present     General:  Alert, oriented and cooperative. Patient is in no acute distress.  Skin: Skin is warm and dry. No rash noted.   Cardiovascular: Normal heart rate noted  Respiratory: Normal respiratory effort, no problems with respiration noted  Abdomen: Soft, gravid, appropriate for gestational age. Pain/Pressure: Present     Pelvic:  Cervical exam deferred        Extremities: Normal range of motion.  Edema: None  Mental Status: Normal mood and affect. Normal behavior. Normal judgment and thought content.   Assessment and Plan:  Pregnancy: G2P1001 at 5675w6d  1. Supervision of high risk pregnancy, antepartum FHT and FH normal  Term labor symptoms and general obstetric precautions including but not limited to vaginal bleeding, contractions, leaking of fluid and fetal movement were reviewed in detail with the patient. Please refer to After Visit Summary for other counseling recommendations.  No Follow-up on file.   Levie HeritageJacob J Steaven Wholey, DO

## 2016-05-11 ENCOUNTER — Ambulatory Visit (INDEPENDENT_AMBULATORY_CARE_PROVIDER_SITE_OTHER): Payer: Self-pay | Admitting: Family Medicine

## 2016-05-11 VITALS — BP 120/77 | HR 91 | Wt 176.0 lb

## 2016-05-11 DIAGNOSIS — O0930 Supervision of pregnancy with insufficient antenatal care, unspecified trimester: Secondary | ICD-10-CM

## 2016-05-11 DIAGNOSIS — O0990 Supervision of high risk pregnancy, unspecified, unspecified trimester: Secondary | ICD-10-CM

## 2016-05-11 NOTE — Progress Notes (Signed)
   PRENATAL VISIT NOTE Spanish interpreter: [phone] used  Subjective:  Erin Henry is a 30 y.o. G2P1001 at 6449w4d being seen today for ongoing prenatal care.  She is currently monitored for the following issues for this low-risk pregnancy and has Late prenatal care affecting pregnancy, antepartum and Supervision of high risk pregnancy, antepartum on her problem list.  Patient reports no complaints.  Contractions: Irregular. Vag. Bleeding: None.  Movement: Present. Denies leaking of fluid.   The following portions of the patient's history were reviewed and updated as appropriate: allergies, current medications, past family history, past medical history, past social history, past surgical history and problem list. Problem list updated.  Objective:   Vitals:   05/11/16 1400  BP: 120/77  Pulse: 91  Weight: 176 lb (79.8 kg)    Fetal Status: Fetal Heart Rate (bpm): 140 Fundal Height: 34 cm Movement: Present  Presentation: Vertex  General:  Alert, oriented and cooperative. Patient is in no acute distress.  Skin: Skin is warm and dry. No rash noted.   Cardiovascular: Normal heart rate noted  Respiratory: Normal respiratory effort, no problems with respiration noted  Abdomen: Soft, gravid, appropriate for gestational age. Pain/Pressure: Present     Pelvic:  Cervical exam deferred        Extremities: Normal range of motion.  Edema: None  Mental Status: Normal mood and affect. Normal behavior. Normal judgment and thought content.   Assessment and Plan:  Pregnancy: G2P1001 at 1949w4d  1. Late prenatal care affecting pregnancy, antepartum  2. Supervision of high risk pregnancy, antepartum Continue routine prenatal care.  Term labor symptoms and general obstetric precautions including but not limited to vaginal bleeding, contractions, leaking of fluid and fetal movement were reviewed in detail with the patient.  Please refer to After Visit Summary for other counseling  recommendations.   Return in 1 week (on 05/18/2016).   Reva Boresanya S Ryenne Lynam, MD

## 2016-05-11 NOTE — Patient Instructions (Addendum)
Etonogestrel implant Qu es este medicamento? El ETONOGESTREL es un dispositivo anticonceptivo (control de la natalidad). Se utiliza para Neurosurgeonprevenir el embarazo. Se puede utilizar hasta 3 aos. MARCAS COMUNES: Implanon, Nexplanon Qu le debo informar a mi profesional de la salud antes de tomar este medicamento? Necesita saber si usted presenta alguno de los siguientes problemas o situaciones: sangrado vaginal anormal enfermedad vascular o cogulos sanguneos cncer de mama, cervical, heptico depresin diabetes enfermedad de la vescula biliar dolores de cabeza enfermedad cardiaca o ataque cardiaco reciente alta presin sangunea alto nivel de colesterol enfermedad renal enfermedad heptica convulsiones fuma tabaco una reaccin alrgica o inusual al etonogestrel, otras hormonas, anestsicos o antispticos, medicamentos, alimentos, colorantes o conservantes si est embarazada o buscando quedar embarazada si est amamantando a un beb Cmo debo SLM Corporationutilizar este medicamento? Este dispositivo se inserta debajo de la piel en la cara interna de la parte superior del brazo por un profesional de Radiographer, therapeuticla salud. Hable con su pediatra para informarse acerca del uso de este medicamento en nios. Puede requerir atencin especial. Qu sucede si me Azucena Fallenolvido de una dosis? No se aplica en este caso. Qu puede interactuar con este medicamento? No tome esta medicina con ninguno de los siguientes medicamentos: amprenavir bosentano fosamprenavir Esta medicina tambin puede interactuar con los siguientes medicamentos: medicamentos barbitricos para inducir el sueo o tratar convulsiones ciertos medicamentos para las infecciones micticas tales como quetoconazol e itraconazol griseofulvina medicamentos para tratar convulsiones, tales como carbamazepina, felbamato, Agricultural engineeroxcarbazepina, fenitona, topiramato modafinil fenilbutazona rifampicina algunos medicamentos para tratar la infeccin por VIH tales como atazanavir, indinavir,  lopinavir, nelfinavir, tipranavir, ritonavir hierba de [place] qu debo estar atento al usar este medicamento? Este producto no protege contra la infeccin por el VIH (SIDA) u otras enfermedades de transmisin sexual. Usted debe sentir el implante al presionar con las yemas de los dedos sobre la piel donde se insert. Contacte a su mdico si no se siente el implante y [place] un mtodo anticonceptivo no hormonal (como el condn) hasta que el mdico confirma que el implante est en su Environmental consultantlugar. Si siente que el implante puede haber roto o doblado en su brazo, pngase en contacto con su proveedor de atencin mdica. Qu efectos secundarios puedo tener al Boston Scientificutilizar este medicamento? Efectos secundarios que debe informar a su mdico o a Producer, television/film/videosu profesional de la salud tan pronto como sea posible: Therapist, artreacciones alrgicas, como erupcin cutnea, picazn o urticarias, e hinchazn de la cara, los labios o la lengua bultos en las mamas cambios en las emociones o el estado de nimo estado de nimo deprimido sangrado menstrual intenso o Software engineerprolongado dolor, irritacin, hinchazn o Counsellormoretones en el lugar de la insercin Immunologistcicatriz en el lugar de la insercin signos de infeccin en el sitio de insercin tales como fiebre, y enrojecimiento de la piel, Engineer, miningdolor o secrecin signos de Psychiatristembarazo signos y sntomas de un cogulo sanguneo, tales como problemas respiratorios; cambios en la visin; dolor en el pecho; dolor de cabeza severo, repentino; dolor, hinchazn, calor en la pierna; dificultad para hablar; entumecimiento o debilidad repentina de la cara, el brazo o la pierna signos y sntomas de lesin al hgado, como orina amarilla oscura o [place]; sensacin general de estar enfermo o sntomas gripales; heces claras; prdida de apetito; nuseas; dolor en la regin abdominal superior derecha; cansancio o debilidad inusual; color amarillento de los ojos o la piel sangrado vaginal inusual, secrecin signos y sntomas de un derrame cerebral, tales  como cambios en la visin; confusin; dificultad para hablar o entender;  dolores de Corporate treasurer; entumecimiento o debilidad repentina de la cara, el brazo o la pierna; problemas al caminar; Psychiatrist; prdida del equilibrio o la coordinacinEfectos secundarios que generalmente no requieren Psychologist, prison and probation services (infrmelos a su mdico o a Producer, television/film/video de la salud si persisten o si son molestos): acn dolor de Retail buyer en las mamas cambios de peso mareos sensacin general de estar enfermo o sntomas gripales dolor de cabeza sangrado menstrual irregular nuseas dolor de garganta irritacin o inflamacin vaginal Dnde debo guardar mi medicina? Este medicamento se administra en hospitales o clnicas y no necesitar guardarlo en su domicilio.  2017 Elsevier/Gold Standard (2015-09-16 00:00:00)    Second Trimester of Pregnancy The second trimester is from week 13 through week 28 (months 4 through 6). The second trimester is often a time when you feel your best. Your body has also adjusted to being pregnant, and you begin to feel better physically. Usually, morning sickness has lessened or quit completely, you may have more energy, and you may have an increase in appetite. The second trimester is also a time when the fetus is growing rapidly. At the end of the sixth month, the fetus is about 9 inches long and weighs about 1 pounds. You will likely begin to feel the baby move (quickening) between 18 and 20 weeks of the pregnancy. Body changes during your second trimester Your body continues to go through many changes during your second trimester. The changes vary from woman to woman.  Your weight will continue to increase. You will notice your lower abdomen bulging out.  You may begin to get stretch marks on your hips, abdomen, and breasts.  You may develop headaches that can be relieved by medicines. The medicines should be approved by your health care provider.  You may urinate more often because the  fetus is pressing on your bladder.  You may develop or continue to have heartburn as a result of your pregnancy.  You may develop constipation because certain hormones are causing the muscles that push waste through your intestines to slow down.  You may develop hemorrhoids or swollen, bulging veins (varicose veins).  You may have back pain. This is caused by:  Weight gain.  Pregnancy hormones that are relaxing the joints in your pelvis.  A shift in weight and the muscles that support your balance.  Your breasts will continue to grow and they will continue to become tender.  Your gums may bleed and may be sensitive to brushing and flossing.  Dark spots or blotches (chloasma, mask of pregnancy) may develop on your face. This will likely fade after the baby is born.  A dark line from your belly button to the pubic area (linea nigra) may appear. This will likely fade after the baby is born.  You may have changes in your hair. These can include thickening of your hair, rapid growth, and changes in texture. Some women also have hair loss during or after pregnancy, or hair that feels dry or thin. Your hair will most likely return to normal after your baby is born. What to expect at prenatal visits During a routine prenatal visit:  You will be weighed to make sure you and the fetus are growing normally.  Your blood pressure will be taken.  Your abdomen will be measured to track your baby's growth.  The fetal heartbeat will be listened to.  Any test results from the previous visit will be discussed. Your health care provider may ask you:  How  you are feeling.  If you are feeling the baby move.  If you have had any abnormal symptoms, such as leaking fluid, bleeding, severe headaches, or abdominal cramping.  If you are using any tobacco products, including cigarettes, chewing tobacco, and electronic cigarettes.  If you have any questions. Other tests that may be performed during  your second trimester include:  Blood tests that check for:  Low iron levels (anemia).  Gestational diabetes (between 24 and 28 weeks).  Rh antibodies. This is to check for a protein on red blood cells (Rh factor).  Urine tests to check for infections, diabetes, or protein in the urine.  An ultrasound to confirm the proper growth and development of the baby.  An amniocentesis to check for possible genetic problems.  Fetal screens for spina bifida and Down syndrome.  HIV (human immunodeficiency virus) testing. Routine prenatal testing includes screening for HIV, unless you choose not to have this test. Follow these instructions at home: Eating and drinking  Continue to eat regular, healthy meals.  Avoid raw meat, uncooked cheese, cat litter boxes, and soil used by cats. These carry germs that can cause birth defects in the baby.  Take your prenatal vitamins.  Take 1500-2000 mg of calcium daily starting at the 20th week of pregnancy until you deliver your baby.  If you develop constipation:  Take over-the-counter or prescription medicines.  Drink enough fluid to keep your urine clear or pale yellow.  Eat foods that are high in fiber, such as fresh fruits and vegetables, whole grains, and beans.  Limit foods that are high in fat and processed sugars, such as fried and sweet foods. Activity  Exercise only as directed by your health care provider. Experiencing uterine cramps is a good sign to stop exercising.  Avoid heavy lifting, wear low heel shoes, and practice good posture.  Wear your seat belt at all times when driving.  Rest with your legs elevated if you have leg cramps or low back pain.  Wear a good support bra for breast tenderness.  Do not use hot tubs, steam rooms, or saunas. Lifestyle  Avoid all smoking, herbs, alcohol, and unprescribed drugs. These chemicals affect the formation and growth of the baby.  Do not use any products that contain nicotine or  tobacco, such as cigarettes and e-cigarettes. If you need help quitting, ask your health care provider.  A sexual relationship may be continued unless your health care provider directs you otherwise. General instructions  Follow your health care provider's instructions regarding medicine use. There are medicines that are either safe or unsafe to take during pregnancy.  Take warm sitz baths to soothe any pain or discomfort caused by hemorrhoids. Use hemorrhoid cream if your health care provider approves.  If you develop varicose veins, wear support hose. Elevate your feet for 15 minutes, 3-4 times a day. Limit salt in your diet.  Visit your dentist if you have not gone yet during your pregnancy. Use a soft toothbrush to brush your teeth and be gentle when you floss.  Keep all follow-up prenatal visits as told by your health care provider. This is important. Contact a health care provider if:  You have dizziness.  You have mild pelvic cramps, pelvic pressure, or nagging pain in the abdominal area.  You have persistent nausea, vomiting, or diarrhea.  You have a bad smelling vaginal discharge.  You have pain with urination. Get help right away if:  You have a fever.  You are leaking fluid  from your vagina.  You have spotting or bleeding from your vagina.  You have severe abdominal cramping or pain.  You have rapid weight gain or weight loss.  You have shortness of breath with chest pain.  You notice sudden or extreme swelling of your face, hands, ankles, feet, or legs.  You have not felt your baby move in over an hour.  You have severe headaches that do not go away with medicine.  You have vision changes. Summary  The second trimester is from week 13 through week 28 (months 4 through 6). It is also a time when the fetus is growing rapidly.  Your body goes through many changes during pregnancy. The changes vary from woman to woman.  Avoid all smoking, herbs, alcohol,  and unprescribed drugs. These chemicals affect the formation and growth your baby.  Do not use any tobacco products, such as cigarettes, chewing tobacco, and e-cigarettes. If you need help quitting, ask your health care provider.  Contact your health care provider if you have any questions. Keep all prenatal visits as told by your health care provider. This is important. This information is not intended to replace advice given to you by your health care provider. Make sure you discuss any questions you have with your health care provider. Document Released: 05/26/2001 Document Revised: 11/07/2015 Document Reviewed: 08/02/2012 Elsevier Interactive Patient Education  2017 [employer].   Breastfeeding Deciding to breastfeed is one of the best choices you can make for you and your baby. A change in hormones during pregnancy causes your breast tissue to grow and increases the number and size of your milk ducts. These hormones also allow proteins, sugars, and fats from your blood supply to make breast milk in your milk-producing glands. Hormones prevent breast milk from being released before your baby is born as well as prompt milk flow after birth. Once breastfeeding has begun, thoughts of your baby, as well as his or her sucking or crying, can stimulate the release of milk from your milk-producing glands. Benefits of breastfeeding For Your Baby  Your first milk (colostrum) helps your baby's digestive system function better.  There are antibodies in your milk that help your baby fight off infections.  Your baby has a lower incidence of asthma, allergies, and sudden infant death syndrome.  The nutrients in breast milk are better for your baby than infant formulas and are designed uniquely for your baby's needs.  Breast milk improves your baby's brain development.  Your baby is less likely to develop other conditions, such as childhood obesity, asthma, or type 2 diabetes mellitus. For  You  Breastfeeding helps to create a very special bond between you and your baby.  Breastfeeding is convenient. Breast milk is always available at the correct temperature and costs nothing.  Breastfeeding helps to burn calories and helps you lose the weight gained during pregnancy.  Breastfeeding makes your uterus contract to its prepregnancy size faster and slows bleeding (lochia) after you give birth.  Breastfeeding helps to lower your risk of developing type 2 diabetes mellitus, osteoporosis, and breast or ovarian cancer later in life. Signs that your baby is hungry Early Signs of Hunger  Increased alertness or activity.  Stretching.  Movement of the head from side to side.  Movement of the head and opening of the mouth when the corner of the mouth or cheek is stroked (rooting).  Increased sucking sounds, smacking lips, cooing, sighing, or squeaking.  Hand-to-mouth movements.  Increased sucking of fingers or  hands. Late Signs of Hunger  Fussing.  Intermittent crying. Extreme Signs of Hunger  Signs of extreme hunger will require calming and consoling before your baby will be able to breastfeed successfully. Do not wait for the following signs of extreme hunger to occur before you initiate breastfeeding:  Restlessness.  A loud, strong cry.  Screaming. Breastfeeding basics  Breastfeeding Initiation  Find a comfortable place to sit or lie down, with your neck and back well supported.  Place a pillow or rolled up blanket under your baby to bring him or her to the level of your breast (if you are seated). Nursing pillows are specially designed to help support your arms and your baby while you breastfeed.  Make sure that your baby's abdomen is facing your abdomen.  Gently massage your breast. With your fingertips, massage from your chest wall toward your nipple in a circular motion. This encourages milk flow. You may need to continue this action during the feeding if your  milk flows slowly.  Support your breast with 4 fingers underneath and your thumb above your nipple. Make sure your fingers are well away from your nipple and your baby's mouth.  Stroke your baby's lips gently with your finger or nipple.  When your baby's mouth is open wide enough, quickly bring your baby to your breast, placing your entire nipple and as much of the colored area around your nipple (areola) as possible into your baby's mouth.  More areola should be visible above your baby's upper lip than below the lower lip.  Your baby's tongue should be between his or her lower gum and your breast.  Ensure that your baby's mouth is correctly positioned around your nipple (latched). Your baby's lips should create a seal on your breast and be turned out (everted).  It is common for your baby to suck about 2-3 minutes in order to start the flow of breast milk. Latching  Teaching your baby how to latch on to your breast properly is very important. An improper latch can cause nipple pain and decreased milk supply for you and poor weight gain in your baby. Also, if your baby is not latched onto your nipple properly, he or she may swallow some air during feeding. This can make your baby fussy. Burping your baby when you switch breasts during the feeding can help to get rid of the air. However, teaching your baby to latch on properly is still the best way to prevent fussiness from swallowing air while breastfeeding. Signs that your baby has successfully latched on to your nipple:  Silent tugging or silent sucking, without causing you pain.  Swallowing heard between every 3-4 sucks.  Muscle movement above and in front of his or her ears while sucking. Signs that your baby has not successfully latched on to nipple:  Sucking sounds or smacking sounds from your baby while breastfeeding.  Nipple pain. If you think your baby has not latched on correctly, slip your finger into the corner of your baby's  mouth to break the suction and place it between your baby's gums. Attempt breastfeeding initiation again. Signs of Successful Breastfeeding  Signs from your baby:  A gradual decrease in the number of sucks or complete cessation of sucking.  Falling asleep.  Relaxation of his or her body.  Retention of a small amount of milk in his or her mouth.  Letting go of your breast by himself or herself. Signs from you:  Breasts that have increased in firmness, weight,  and size 1-3 hours after feeding.  Breasts that are softer immediately after breastfeeding.  Increased milk volume, as well as a change in milk consistency and color by the fifth day of breastfeeding.  Nipples that are not sore, cracked, or bleeding. Signs That Your Pecola Leisure is Getting Enough Milk  Wetting at least 1-2 diapers during the first 24 hours after birth.  Wetting at least 5-6 diapers every 24 hours for the first week after birth. The urine should be clear or pale yellow by 5 days after birth.  Wetting 6-8 diapers every 24 hours as your baby continues to grow and develop.  At least 3 stools in a 24-hour period by age 34 days. The stool should be soft and yellow.  At least 3 stools in a 24-hour period by age 23 days. The stool should be seedy and yellow.  No loss of weight greater than 10% of birth weight during the first 57 days of age.  Average weight gain of 4-7 ounces (113-198 g) per week after age 78 days.  Consistent daily weight gain by age 34 days, without weight loss after the age of 2 weeks. After a feeding, your baby may spit up a small amount. This is common. Breastfeeding frequency and duration Frequent feeding will help you make more milk and can prevent sore nipples and breast engorgement. Breastfeed when you feel the need to reduce the fullness of your breasts or when your baby shows signs of hunger. This is called "breastfeeding on demand." Avoid introducing a pacifier to your baby while you are working  to establish breastfeeding (the first 4-6 weeks after your baby is born). After this time you may choose to use a pacifier. Research has shown that pacifier use during the first year of a baby's life decreases the risk of sudden infant death syndrome (SIDS). Allow your baby to feed on each breast as long as he or she wants. Breastfeed until your baby is finished feeding. When your baby unlatches or falls asleep while feeding from the first breast, offer the second breast. Because newborns are often sleepy in the first few weeks of life, you may need to awaken your baby to get him or her to feed. Breastfeeding times will vary from baby to baby. However, the following rules can serve as a guide to help you ensure that your baby is properly fed:  Newborns (babies 67 weeks of age or younger) may breastfeed every 1-3 hours.  Newborns should not go longer than 3 hours during the day or 5 hours during the night without breastfeeding.  You should breastfeed your baby a minimum of 8 times in a 24-hour period until you begin to introduce solid foods to your baby at around 63 months of age. Breast milk pumping Pumping and storing breast milk allows you to ensure that your baby is exclusively fed your breast milk, even at times when you are unable to breastfeed. This is especially important if you are going back to work while you are still breastfeeding or when you are not able to be present during feedings. Your lactation consultant can give you guidelines on how long it is safe to store breast milk. A breast pump is a machine that allows you to pump milk from your breast into a sterile bottle. The pumped breast milk can then be stored in a refrigerator or freezer. Some breast pumps are operated by hand, while others use electricity. Ask your lactation consultant which type will work best for  you. Breast pumps can be purchased, but some hospitals and breastfeeding support groups lease breast pumps on a monthly basis.  A lactation consultant can teach you how to hand express breast milk, if you prefer not to use a pump. Caring for your breasts while you breastfeed Nipples can become dry, cracked, and sore while breastfeeding. The following recommendations can help keep your breasts moisturized and healthy:  Avoid using soap on your nipples.  Wear a supportive bra. Although not required, special nursing bras and tank tops are designed to allow access to your breasts for breastfeeding without taking off your entire bra or top. Avoid wearing underwire-style bras or extremely tight bras.  Air dry your nipples for 3-334minutes after each feeding.  Use only cotton bra pads to absorb leaked breast milk. Leaking of breast milk between feedings is normal.  Use lanolin on your nipples after breastfeeding. Lanolin helps to maintain your skin's normal moisture barrier. If you use pure lanolin, you do not need to wash it off before feeding your baby again. Pure lanolin is not toxic to your baby. You may also hand express a few drops of breast milk and gently massage that milk into your nipples and allow the milk to air dry. In the first few weeks after giving birth, some women experience extremely full breasts (engorgement). Engorgement can make your breasts feel heavy, warm, and tender to the touch. Engorgement peaks within 3-5 days after you give birth. The following recommendations can help ease engorgement:  Completely empty your breasts while breastfeeding or pumping. You may want to start by applying warm, moist heat (in the shower or with warm water-soaked hand towels) just before feeding or pumping. This increases circulation and helps the milk flow. If your baby does not completely empty your breasts while breastfeeding, pump any extra milk after he or she is finished.  Wear a snug bra (nursing or regular) or tank top for 1-2 days to signal your body to slightly decrease milk production.  Apply ice packs to your  breasts, unless this is too uncomfortable for you.  Make sure that your baby is latched on and positioned properly while breastfeeding. If engorgement persists after 48 hours of following these recommendations, contact your health care provider or a Advertising copywriterlactation consultant. Overall health care recommendations while breastfeeding  Eat healthy foods. Alternate between meals and snacks, eating 3 of each per day. Because what you eat affects your breast milk, some of the foods may make your baby more irritable than usual. Avoid eating these foods if you are sure that they are negatively affecting your baby.  Drink milk, fruit juice, and water to satisfy your thirst (about 10 glasses a day).  Rest often, relax, and continue to take your prenatal vitamins to prevent fatigue, stress, and anemia.  Continue breast self-awareness checks.  Avoid chewing and smoking tobacco. Chemicals from cigarettes that pass into breast milk and exposure to secondhand smoke may harm your baby.  Avoid alcohol and drug use, including marijuana. Some medicines that may be harmful to your baby can pass through breast milk. It is important to ask your health care provider before taking any medicine, including all over-the-counter and prescription medicine as well as vitamin and herbal supplements. It is possible to become pregnant while breastfeeding. If birth control is desired, ask your health care provider about options that will be safe for your baby. Contact a health care provider if:  You feel like you want to stop breastfeeding or have become  frustrated with breastfeeding.  You have painful breasts or nipples.  Your nipples are cracked or bleeding.  Your breasts are red, tender, or warm.  You have a swollen area on either breast.  You have a fever or chills.  You have nausea or vomiting.  You have drainage other than breast milk from your nipples.  Your breasts do not become full before feedings by the  fifth day after you give birth.  You feel sad and depressed.  Your baby is too sleepy to eat well.  Your baby is having trouble sleeping.  Your baby is wetting less than 3 diapers in a 24-hour period.  Your baby has less than 3 stools in a 24-hour period.  Your baby's skin or the white part of his or her eyes becomes yellow.  Your baby is not gaining weight by 62 days of age. Get help right away if:  Your baby is overly tired (lethargic) and does not want to wake up and feed.  Your baby develops an unexplained fever. This information is not intended to replace advice given to you by your health care provider. Make sure you discuss any questions you have with your health care provider. Document Released: 06/01/2005 Document Revised: 11/13/2015 Document Reviewed: 11/23/2012 Elsevier Interactive Patient Education  2017 [employer].

## 2016-05-19 ENCOUNTER — Ambulatory Visit (INDEPENDENT_AMBULATORY_CARE_PROVIDER_SITE_OTHER): Payer: Self-pay | Admitting: Student

## 2016-05-19 ENCOUNTER — Ambulatory Visit (HOSPITAL_COMMUNITY)
Admission: RE | Admit: 2016-05-19 | Discharge: 2016-05-19 | Disposition: A | Discharge status: Home / Self Care | Payer: Self-pay | Source: Ambulatory Visit | Attending: Student | Admitting: Student

## 2016-05-19 DIAGNOSIS — O0933 Supervision of pregnancy with insufficient antenatal care, third trimester: Secondary | ICD-10-CM | POA: Insufficient documentation

## 2016-05-19 DIAGNOSIS — O0993 Supervision of high risk pregnancy, unspecified, third trimester: Secondary | ICD-10-CM

## 2016-05-19 DIAGNOSIS — O26843 Uterine size-date discrepancy, third trimester: Secondary | ICD-10-CM | POA: Insufficient documentation

## 2016-05-19 DIAGNOSIS — O365930 Maternal care for other known or suspected poor fetal growth, third trimester, not applicable or unspecified: Secondary | ICD-10-CM

## 2016-05-19 DIAGNOSIS — Z3A39 39 weeks gestation of pregnancy: Secondary | ICD-10-CM | POA: Insufficient documentation

## 2016-05-19 NOTE — Patient Instructions (Signed)
Parto vaginal (Vaginal Delivery) Durante el parto, el mdico la ayudar a dar a luz a su beb. En elparto vaginal, deber pujar para que el beb salga por la vagina. Sin embargo, antes de que pueda sacar al beb, es necesario que ocurran ciertas cosas. La abertura del tero (cuello del tero) tiene que ablandarse, hacerse ms delgado y abrirse (dilatar) hasta que llegue a 10 cm. Adems, el beb tiene que bajar desde el tero a la vagina. SIGNOS DE TRABAJO DE PARTO  El mdico tendr primero que asegurarse de que usted est en trabajo de parto. Algunos signos son:   Eliminar lo que se llama tapn mucoso antes del inicio del trabajo de parto. Este es una pequea cantidad de mucosidad teida con sangre.  Tener contracciones uterinas regulares y dolorosas.   El tiempo entre las contracciones debe acortarse  Las molestias y el dolor se harn ms intensos gradualmente.  El dolor de las contracciones empeora al caminar y no se alivia con el reposo.   El cuello del tero se hace mas delgado (se borra) y se dilata. ANTES DEL PARTO Una vez que se inicie el trabajo de parto y sea admitida en el hospital o sanatorio, el mdico podr hacer lo siguiente:   Realizar un examen fsico.  Controlar si hay complicaciones relacionadas con el trabajo de parto.  Verificar su presin arterial, temperatura y pulso y la frecuencia cardaca (signos vitales).   Determinar si se ha roto el saco amnitico y cundo ha ocurrido.  Realizar un examen vaginal (utilizando un guante estril y un lubricante) para determinar: ? La posicin (presentacin) del beb. El beb se presenta con la cabeza primero (vertex) en el canal de parto (vagina), o estn los pies o las nalgas primero (de nalgas)? ? El nivel (estacin) de la cabeza del beb dentro del canal de parto. ? El borramiento y la dilatacin del cuello uterino  El monitor fetal electrnico generalmente se coloca sobre el abdomen al llegar. Se utiliza para  controlar las contracciones y la frecuencia cardaca del beb. ? Cuando el monitor est en el abdomen (monitor fetal externo), slo toma la frecuencia y la duracin de las contracciones. No informa acerca de la intensidad de las contracciones. ? Si el mdico necesita saber exactamente la intensidad de las contracciones o cul es la frecuencia cardaca del beb, colocar un monitor interno en la vagina y el tero. El mdico comentar los riesgos y los beneficios de usar un monitor interno y le pedir autorizacin antes de colocar el dispositivo. ? El monitoreo fetal continuo ser necesario si le han aplicado una epidural, si le administran ciertos medicamentos (como oxitocina) y si tiene complicaciones del embarazo o del trabajo de parto.  Podrn colocarle una va intravenosa en una vena del brazo para suministrarle lquidos y medicamentos, si es necesario. TRES ETAPAS DEL TRABAJO DE PARTO Y EL PARTO El trabajo de parto y el parto normales se dividen en tres etapas. Primera etapa Esta etapa comienza cuando comienzan las contracciones regulares y el cuello comienza a borrarse y dilatarse. Finaliza cuando el cuello est completamente abierto (completamente dilatado). La primera etapa es la etapa ms larga del trabajo de parto y puede durar desde 3 horas a 15 horas.  Algunos mtodos estn disponibles para ayudar con el dolor del parto. Usted y su mdico decidirn qu opcin es la mejor para usted. Las opciones incluyen:   Medicamentos narcticos. Estos son medicamentos fuertes que usted puede recibir a travs de una va intravenosa o   como inyeccin en el msculo. Estos medicamentos alivian el dolor pero no hacen que desaparezca completamente.  Epidural. Se administra un medicamento a travs de un tubo delgado que se inserta en la espalda. El medicamento adormece la parte inferior del cuerpo y evita el dolor en esa zona.  Bloqueo paracervical Es una inyeccin de un anestsico en cada lado del cuello  uterino.  Usted podr pedir un parto natural, que implica que no se usen analgsicos ni epidural durante el parto y el trabajo de parto. En cambio, podr tener otro tipo de ayuda como ejercicios respiratorios para hacer frente al dolor. Segunda etapa La segunda etapa del trabajo de parto comienza cuando el cuello se ha dilatado completamente a 10 cm. Contina hasta que usted puja al beb hacia abajo, por el canal de parto, y el beb nace. Esta etapa puede durar slo algunos minutos o algunas horas.  La posicin del la cabeza del beb a medida que pasa por el canal de parto, es informada como un nmero, llamado estacin. Si la cabeza del beb no ha iniciado su descenso, la estacin se describe como que est en menos 3 (-3). Cuando la cabeza del beb est en la estacin cero, est en el medio del canal de parto y se encaja en la pelvis. La estacin en la que se encuentra el beb indica el progreso de la segunda etapa del trabajo de parto.  Cuando el beb nace, el mdico lo sostendr con la cabeza hacia abajo para evitar que el lquido amnitico, el moco y la sangre entren en los pulmones del beb. La boca y la nariz del beb podrn ser succionadas con un pequeo bulbo para retirar todo lquido adicional.  El mdico podr colocar al beb sobre su estmago. Es importante evitar que el beb tome fro. Para hacerlo, el mdico secar al beb, lo colocar directamente sobre su piel, (sin mantas entre usted y el beb) y lo cubrir con mantas secas y tibias.  Se corta el cordn umbilical. Tercera etapa Durante la tercera etapa del trabajo de parto, el mdico sacar la placenta (alumbramiento) y se asegurar de que el sangrado est controlado. La salida de la placenta generalmente demora 5 minutos pero puede tardar hasta 30 minutos. Luego de la salida de la placenta, le darn un medicamento por va intravenosa o inyectable para ayudar a contraer el tero y controlar el sangrado. Si planea amamantar al beb,  puede intentar en este momento Luego de la salida de la placenta, el tero debe contraerse y quedar muy firme. Si el tero no queda firme, el mdico lo masajear. Esto es importante debido a que la contraccin del tero ayuda a cortar el sangrado en el sitio en que la placenta estaba unida al tero. Si el tero no se contrae adecuadamente ni permanece firme, podr causar un sangrado abundante. Si hay mucho sangrado, podrn darle medicamentos para contraer el tero y detener el sangrado.  Esta informacin no tiene como fin reemplazar el consejo del mdico. Asegrese de hacerle al mdico cualquier pregunta que tenga. Document Released: 05/14/2008 Document Revised: 06/22/2014 Elsevier Interactive Patient Education  2017 Elsevier Inc.  

## 2016-05-19 NOTE — Progress Notes (Signed)
Interpreter Erin HomerMary Adams Pt reports contractions are coming more frequently  US scheduled for December 5th @ 1330.  Pt notified.

## 2016-05-19 NOTE — Progress Notes (Signed)
   PRENATAL VISIT NOTE  Subjective:  Erin Henry is a 30 y.o. G2P1001 at 7056w5d being seen today for ongoing prenatal care.  She is currently monitored for the following issues for this low-risk pregnancy and has Late prenatal care affecting pregnancy, antepartum and Supervision of high risk pregnancy, antepartum on her problem list.  Patient reports no complaints.  Contractions: Irregular. Vag. Bleeding: None.  Movement: Present. Denies leaking of fluid.   The following portions of the patient's history were reviewed and updated as appropriate: allergies, current medications, past family history, past medical history, past social history, past surgical history and problem list. Problem list updated.  Objective:   Vitals:   05/19/16 0946  BP: 134/76  Weight: 178 lb 3.2 oz (80.8 kg)    Fetal Status: Fetal Heart Rate (bpm): 147   Movement: Present     General:  Alert, oriented and cooperative. Patient is in no acute distress.  Skin: Skin is warm and dry. No rash noted.   Cardiovascular: Normal heart rate noted  Respiratory: Normal respiratory effort, no problems with respiration noted  Abdomen: Soft, gravid, appropriate for gestational age. Pain/Pressure: Present     Pelvic:  Cervical exam deferred        Extremities: Normal range of motion.  Edema: None  Mental Status: Normal mood and affect. Normal behavior. Normal judgment and thought content.   Assessment and Plan:  Pregnancy: G2P1001 at 5556w5d  1. SGA (small for gestational age) Patient has measured smaller in the past and today measures 37 weeks. While this is not outside of the normal range (patient is 39 weeks and 5 days), given her history of small measurements patients sent to growth US today.  - US MFM OB COMP + 14 WK; Future  2. Pregnancy, supervision, high-risk, third trimester  - US MFM OB COMP + 14 WK; Future  Term labor symptoms and general obstetric precautions including but not limited to vaginal  bleeding, contractions, leaking of fluid and fetal movement were reviewed in detail with the patient. Please refer to After Visit Summary for other counseling recommendations.  Return in about 1 week (around 05/26/2016).   Erin Henry, CNM

## 2016-05-21 ENCOUNTER — Inpatient Hospital Stay (HOSPITAL_COMMUNITY)
Admission: AD | Admit: 2016-05-21 | Discharge: 2016-05-21 | Disposition: A | Discharge status: Home / Self Care | Payer: Self-pay | Source: Ambulatory Visit | Attending: Obstetrics and Gynecology | Admitting: Obstetrics and Gynecology

## 2016-05-21 ENCOUNTER — Encounter (HOSPITAL_COMMUNITY): Payer: Self-pay | Admitting: Certified Nurse Midwife

## 2016-05-21 DIAGNOSIS — Z3493 Encounter for supervision of normal pregnancy, unspecified, third trimester: Secondary | ICD-10-CM | POA: Insufficient documentation

## 2016-05-21 DIAGNOSIS — Z3A40 40 weeks gestation of pregnancy: Secondary | ICD-10-CM | POA: Insufficient documentation

## 2016-05-21 NOTE — MAU Note (Signed)
Patient presents to MAU with contractions. Patient states that contraction began around 1700 and rates them a 5/10. She states that she has been leaking fluid since "yesterday" does not know what time she starting leaking. Denies bleeding.

## 2016-05-23 ENCOUNTER — Encounter (HOSPITAL_COMMUNITY): Payer: Self-pay | Admitting: *Deleted

## 2016-05-23 ENCOUNTER — Inpatient Hospital Stay (HOSPITAL_COMMUNITY)
Admission: AD | Admit: 2016-05-23 | Discharge: 2016-05-25 | DRG: 775 | Disposition: A | Discharge status: Home / Self Care | Payer: Medicaid Other | Source: Ambulatory Visit | Attending: Obstetrics & Gynecology | Admitting: Obstetrics & Gynecology

## 2016-05-23 ENCOUNTER — Inpatient Hospital Stay (HOSPITAL_COMMUNITY): Payer: Medicaid Other | Admitting: Anesthesiology

## 2016-05-23 DIAGNOSIS — Z833 Family history of diabetes mellitus: Secondary | ICD-10-CM

## 2016-05-23 DIAGNOSIS — Z3493 Encounter for supervision of normal pregnancy, unspecified, third trimester: Secondary | ICD-10-CM | POA: Diagnosis present

## 2016-05-23 DIAGNOSIS — O134 Gestational [pregnancy-induced] hypertension without significant proteinuria, complicating childbirth: Secondary | ICD-10-CM | POA: Diagnosis present

## 2016-05-23 DIAGNOSIS — Z3A40 40 weeks gestation of pregnancy: Secondary | ICD-10-CM

## 2016-05-23 DIAGNOSIS — O135 Gestational [pregnancy-induced] hypertension without significant proteinuria, complicating the puerperium: Secondary | ICD-10-CM

## 2016-05-23 LAB — COMPREHENSIVE METABOLIC PANEL
ALBUMIN: 3 g/dL — AB (ref 3.5–5.0)
ALK PHOS: 270 U/L — AB (ref 38–126)
ALT: 55 U/L — AB (ref 14–54)
ANION GAP: 11 (ref 5–15)
AST: 39 U/L (ref 15–41)
BILIRUBIN TOTAL: 0.5 mg/dL (ref 0.3–1.2)
BUN: 12 mg/dL (ref 6–20)
CALCIUM: 8.9 mg/dL (ref 8.9–10.3)
CO2: 19 mmol/L — AB (ref 22–32)
CREATININE: 0.49 mg/dL (ref 0.44–1.00)
Chloride: 104 mmol/L (ref 101–111)
GFR calc Af Amer: >60 mL/min (ref >60)
GFR calc non Af Amer: >60 mL/min (ref >60)
GLUCOSE: 102 mg/dL — AB (ref 65–99)
Potassium: 4.3 mmol/L (ref 3.5–5.1)
Sodium: 134 mmol/L — ABNORMAL LOW (ref 135–145)
TOTAL PROTEIN: 6.7 g/dL (ref 6.5–8.1)

## 2016-05-23 LAB — CBC
HEMATOCRIT: 37.9 % (ref 36.0–46.0)
Hemoglobin: 12.7 g/dL (ref 12.0–15.0)
MCH: 28.8 pg (ref 26.0–34.0)
MCHC: 33.5 g/dL (ref 30.0–36.0)
MCV: 85.9 fL (ref 78.0–100.0)
Platelets: 216 10*3/uL (ref 150–400)
RBC: 4.41 MIL/uL (ref 3.87–5.11)
RDW: 13.7 % (ref 11.5–15.5)
WBC: 10.9 10*3/uL — ABNORMAL HIGH (ref 4.0–10.5)

## 2016-05-23 LAB — TYPE AND SCREEN
ABO/RH(D): B POS
Antibody Screen: NEGATIVE

## 2016-05-23 LAB — PROTEIN / CREATININE RATIO, URINE
Creatinine, Urine: 93 mg/dL
PROTEIN CREATININE RATIO: 0.08 mg/mg{creat} (ref 0.00–0.15)
Total Protein, Urine: 7 mg/dL

## 2016-05-23 LAB — ABO/RH: ABO/RH(D): B POS

## 2016-05-23 MED ORDER — ONDANSETRON HCL 4 MG/2ML IJ SOLN: 4.0000 mg | INTRAMUSCULAR | Status: DC | PRN

## 2016-05-23 MED ORDER — PHENYLEPHRINE 40 MCG/ML (10ML) SYRINGE FOR IV PUSH (FOR BLOOD PRESSURE SUPPORT)
80.0000 ug | PREFILLED_SYRINGE | INTRAVENOUS | Status: DC | PRN
  Filled 2016-05-23: qty 5
  Filled 2016-05-23: qty 10

## 2016-05-23 MED ORDER — COCONUT OIL OIL
1.0000 "application " | TOPICAL_OIL | Status: DC | PRN
  Administered 2016-05-24: 1 via TOPICAL
  Filled 2016-05-23: qty 120

## 2016-05-23 MED ORDER — OXYCODONE-ACETAMINOPHEN 5-325 MG PO TABS: 2.0000 | ORAL_TABLET | ORAL | Status: DC | PRN

## 2016-05-23 MED ORDER — ACETAMINOPHEN 325 MG PO TABS: 650.0000 mg | ORAL_TABLET | ORAL | Status: DC | PRN

## 2016-05-23 MED ORDER — OXYTOCIN BOLUS FROM INFUSION: 500.0000 mL | Freq: Once | INTRAVENOUS | Status: DC

## 2016-05-23 MED ORDER — OXYCODONE-ACETAMINOPHEN 5-325 MG PO TABS: 1.0000 | ORAL_TABLET | ORAL | Status: DC | PRN

## 2016-05-23 MED ORDER — FENTANYL CITRATE (PF) 100 MCG/2ML IJ SOLN
50.0000 ug | INTRAMUSCULAR | Status: DC | PRN
  Administered 2016-05-23 (×2): 100 ug via INTRAVENOUS
  Filled 2016-05-23 (×2): qty 2

## 2016-05-23 MED ORDER — ONDANSETRON HCL 4 MG/2ML IJ SOLN: 4.0000 mg | Freq: Four times a day (QID) | INTRAMUSCULAR | Status: DC | PRN

## 2016-05-23 MED ORDER — FLEET ENEMA 7-19 GM/118ML RE ENEM: 1.0000 | ENEMA | RECTAL | Status: DC | PRN

## 2016-05-23 MED ORDER — LACTATED RINGERS IV SOLN: 500.0000 mL | INTRAVENOUS | Status: DC | PRN

## 2016-05-23 MED ORDER — BENZOCAINE-MENTHOL 20-0.5 % EX AERO: 1.0000 "application " | INHALATION_SPRAY | CUTANEOUS | Status: DC | PRN

## 2016-05-23 MED ORDER — PRENATAL MULTIVITAMIN CH
1.0000 | ORAL_TABLET | Freq: Every day | ORAL | Status: DC
  Administered 2016-05-24 – 2016-05-25 (×2): 1 via ORAL
  Filled 2016-05-23 (×2): qty 1

## 2016-05-23 MED ORDER — IBUPROFEN 600 MG PO TABS
600.0000 mg | ORAL_TABLET | Freq: Four times a day (QID) | ORAL | Status: DC
  Administered 2016-05-23 – 2016-05-25 (×8): 600 mg via ORAL
  Filled 2016-05-23 (×9): qty 1

## 2016-05-23 MED ORDER — LACTATED RINGERS IV SOLN
INTRAVENOUS | Status: DC
  Administered 2016-05-23 (×3): via INTRAVENOUS

## 2016-05-23 MED ORDER — ZOLPIDEM TARTRATE 5 MG PO TABS: 5.0000 mg | ORAL_TABLET | Freq: Every evening | ORAL | Status: DC | PRN

## 2016-05-23 MED ORDER — SOD CITRATE-CITRIC ACID 500-334 MG/5ML PO SOLN: 30.0000 mL | ORAL | Status: DC | PRN

## 2016-05-23 MED ORDER — OXYTOCIN 40 UNITS IN LACTATED RINGERS INFUSION - SIMPLE MED
2.5000 [IU]/h | INTRAVENOUS | Status: DC
  Administered 2016-05-23: 2.5 [IU]/h via INTRAVENOUS
  Filled 2016-05-23: qty 1000

## 2016-05-23 MED ORDER — DIBUCAINE 1 % RE OINT: 1.0000 "application " | TOPICAL_OINTMENT | RECTAL | Status: DC | PRN

## 2016-05-23 MED ORDER — ONDANSETRON HCL 4 MG PO TABS: 4.0000 mg | ORAL_TABLET | ORAL | Status: DC | PRN

## 2016-05-23 MED ORDER — EPHEDRINE 5 MG/ML INJ
10.0000 mg | INTRAVENOUS | Status: DC | PRN
  Filled 2016-05-23: qty 4

## 2016-05-23 MED ORDER — PHENYLEPHRINE 40 MCG/ML (10ML) SYRINGE FOR IV PUSH (FOR BLOOD PRESSURE SUPPORT)
80.0000 ug | PREFILLED_SYRINGE | INTRAVENOUS | Status: DC | PRN
  Filled 2016-05-23: qty 10
  Filled 2016-05-23: qty 5

## 2016-05-23 MED ORDER — LACTATED RINGERS IV SOLN
500.0000 mL | Freq: Once | INTRAVENOUS | Status: AC
  Administered 2016-05-23: 500 mL via INTRAVENOUS

## 2016-05-23 MED ORDER — ACETAMINOPHEN 325 MG PO TABS
650.0000 mg | ORAL_TABLET | ORAL | Status: DC | PRN
  Administered 2016-05-23 – 2016-05-24 (×2): 650 mg via ORAL
  Filled 2016-05-23: qty 2

## 2016-05-23 MED ORDER — PROMETHAZINE HCL 25 MG/ML IJ SOLN
25.0000 mg | Freq: Once | INTRAMUSCULAR | Status: AC
  Administered 2016-05-23: 25 mg via INTRAVENOUS
  Filled 2016-05-23: qty 1

## 2016-05-23 MED ORDER — FENTANYL 2.5 MCG/ML BUPIVACAINE 1/10 % EPIDURAL INFUSION (WH - ANES)
14.0000 mL/h | INTRAMUSCULAR | Status: DC | PRN
  Administered 2016-05-23 (×2): 14 mL/h via EPIDURAL
  Filled 2016-05-23: qty 100

## 2016-05-23 MED ORDER — SENNOSIDES-DOCUSATE SODIUM 8.6-50 MG PO TABS
2.0000 | ORAL_TABLET | ORAL | Status: DC
  Administered 2016-05-24 – 2016-05-25 (×2): 2 via ORAL
  Filled 2016-05-23 (×2): qty 2

## 2016-05-23 MED ORDER — SIMETHICONE 80 MG PO CHEW: 80.0000 mg | CHEWABLE_TABLET | ORAL | Status: DC | PRN

## 2016-05-23 MED ORDER — DIPHENHYDRAMINE HCL 25 MG PO CAPS: 25.0000 mg | ORAL_CAPSULE | Freq: Four times a day (QID) | ORAL | Status: DC | PRN

## 2016-05-23 MED ORDER — TETANUS-DIPHTH-ACELL PERTUSSIS 5-2.5-18.5 LF-MCG/0.5 IM SUSP: 0.5000 mL | Freq: Once | INTRAMUSCULAR | Status: DC

## 2016-05-23 MED ORDER — LIDOCAINE HCL (PF) 1 % IJ SOLN
30.0000 mL | INTRAMUSCULAR | Status: DC | PRN
  Filled 2016-05-23: qty 30

## 2016-05-23 MED ORDER — WITCH HAZEL-GLYCERIN EX PADS: 1.0000 "application " | MEDICATED_PAD | CUTANEOUS | Status: DC | PRN

## 2016-05-23 MED ORDER — DIPHENHYDRAMINE HCL 50 MG/ML IJ SOLN: 12.5000 mg | INTRAMUSCULAR | Status: DC | PRN

## 2016-05-23 MED ORDER — LIDOCAINE HCL (PF) 1 % IJ SOLN
INTRAMUSCULAR | Status: DC | PRN
  Administered 2016-05-23: 7 mL via EPIDURAL
  Administered 2016-05-23: 5 mL via EPIDURAL

## 2016-05-23 NOTE — Progress Notes (Signed)
PT sits straight up when she contracts

## 2016-05-23 NOTE — Progress Notes (Signed)
Pt now requesting pain med. Paulina FusiK. Kooistra CNM notified and order received

## 2016-05-23 NOTE — Progress Notes (Signed)
Interpreter paged to evaluate pain relief options.

## 2016-05-23 NOTE — H&P (Signed)
Erin Henry is a 30 y.o. female presenting for Labor. Was noted to have some elevated BPs on admission as well. Denies headache or visual changes.  . OB History    Gravida Para Term Preterm AB Living   2 1 1  0 0 1   SAB TAB Ectopic Multiple Live Births   0 0 0 0 1     Past Medical History:  Diagnosis Date  . Medical history non-contributory    Past Surgical History:  Procedure Laterality Date  . BREAST CYST EXCISION     Family History: family history includes Diabetes in her mother. Social History:  reports that she has never smoked. She has never used smokeless tobacco. She reports that she does not drink alcohol or use drugs.     Maternal Diabetes: No Genetic Screening: Normal Maternal Ultrasounds/Referrals: Normal Fetal Ultrasounds or other Referrals:  None Maternal Substance Abuse:  No Significant Maternal Medications:  None Significant Maternal Lab Results:  Lab values include: Group B Strep negative Other Comments:  None  Review of Systems  Constitutional: Negative for chills, fever and malaise/fatigue.  Eyes: Negative for blurred vision.  Respiratory: Negative for shortness of breath.   Cardiovascular: Negative for chest pain and leg swelling.  Gastrointestinal: Positive for abdominal pain. Negative for constipation, diarrhea and nausea.  Genitourinary: Negative for dysuria.  Musculoskeletal: Negative for back pain.   Maternal Medical History:  Reason for admission: Contractions.  Nausea.  Contractions: Onset was 1-2 hours ago.   Frequency: regular.   Perceived severity is moderate.    Fetal activity: Perceived fetal activity is normal.   Last perceived fetal movement was within the past hour.    Prenatal complications: PIH.   No bleeding, HIV, placental abnormality or preterm labor.   Prenatal Complications - Diabetes: none.    Dilation: 10 Effacement (%): 100 Station: Crowning Exam by:: [email or web address],rnc-ob Blood pressure (!) 145/77,  pulse 96, temperature 97.9 F (36.6 C), temperature source Oral, resp. rate 18, height 5\' 1"  (1.549 m), weight 176 lb 12.8 oz (80.2 kg), last menstrual period 08/15/2015, SpO2 99 %. Maternal Exam:  Uterine Assessment: Contraction strength is moderate.  Contraction frequency is regular.   Abdomen: Patient reports no abdominal tenderness. Fundal height is 37.   Estimated fetal weight is 7.   Fetal presentation: vertex  Introitus: Normal vulva. Normal vagina.  Pelvis: adequate for delivery.   Cervix: Cervix evaluated by digital exam.     Fetal Exam Fetal Monitor Review: Mode: ultrasound.   Baseline rate: 140.  Variability: moderate (6-25 bpm).   Pattern: accelerations present and no decelerations.    Fetal State Assessment: Category I - tracings are normal.     Physical Exam  Constitutional: She is oriented to person, place, and time. She appears well-developed and well-nourished. No distress.  HENT:  Head: Normocephalic.  Neck: Normal range of motion. Neck supple.  Cardiovascular: Normal rate and regular rhythm.   Respiratory: Effort normal and breath sounds normal.  GI: Soft. She exhibits no distension and no mass. There is no tenderness. There is no rebound and no guarding.  Musculoskeletal: Normal range of motion.  Neurological: She is alert and oriented to person, place, and time.  Skin: Skin is warm and dry.  Psychiatric: She has a normal mood and affect.    Prenatal labs: ABO, Rh: --/--/B POS (12/09 0504) Antibody: NEG (12/09 0504) Rubella: 19.10 (10/11 1039) RPR: NON REAC (10/11 1039)  HBsAg: NEGATIVE (10/11 1039)  HIV: NONREACTIVE (10/11 1039)  GBS:  Negative (11/04 0000)   Assessment/Plan: SIUP at 4874w2d Labor Gestational Hypertension  [facility] Routine orders PIH labs Watch BPs   [facility]WILLIAMS,Tildon Silveria 05/23/2016, 1:06 PM

## 2016-05-23 NOTE — Progress Notes (Signed)
MWilliams,cnm called and notified of BP's obtained. No new orders received. Provider reviewing chart.

## 2016-05-23 NOTE — Anesthesia Procedure Notes (Signed)
Epidural Patient location during procedure: OB Start time: 05/23/2016 8:43 AM End time: 05/23/2016 8:46 AM  Staffing Anesthesiologist: Leilani AbleHATCHETT, Jalynn Betzold Performed: anesthesiologist   Preanesthetic Checklist Completed: patient identified, surgical consent, pre-op evaluation, timeout performed, IV checked, risks and benefits discussed and monitors and equipment checked  Epidural Patient position: sitting Prep: site prepped and draped and DuraPrep Patient monitoring: continuous pulse ox and blood pressure Approach: midline Location: L3-L4 Injection technique: LOR air  Needle:  Needle type: Tuohy  Needle gauge: 17 G Needle length: 9 cm and 9 Needle insertion depth: 5 cm cm Catheter type: closed end flexible Catheter size: 19 Gauge Catheter at skin depth: 10 cm Test dose: negative and Other  Assessment Sensory level: T9 Events: blood not aspirated, injection not painful, no injection resistance, negative IV test and no paresthesia  Additional Notes Reason for block:procedure for pain

## 2016-05-23 NOTE — Progress Notes (Signed)
Nonnie DoneKatherine Kooiestra CNM notified of pt's cbc and cmp results. Aware of elevated b/ps, sl headache. Protein/creatine urine in lab as pt just got up to BR for first time. No new orders.

## 2016-05-23 NOTE — Anesthesia Preprocedure Evaluation (Signed)
Anesthesia Evaluation  Patient identified by MRN, date of birth, ID band Patient awake    Reviewed: Allergy & Precautions, H&P , Patient's Chart, lab work & pertinent test results  Airway Mallampati: II  TM Distance: >3 FB Neck ROM: full    Dental no notable dental hx.    Pulmonary neg pulmonary ROS,    Pulmonary exam normal        Cardiovascular negative cardio ROS Normal cardiovascular exam     Neuro/Psych negative neurological ROS  negative psych ROS   GI/Hepatic negative GI ROS, Neg liver ROS,   Endo/Other  negative endocrine ROS  Renal/GU negative Renal ROS     Musculoskeletal   Abdominal (+) + obese,   Peds  Hematology negative hematology ROS (+)   Anesthesia Other Findings   Reproductive/Obstetrics (+) Pregnancy                             Anesthesia Physical Anesthesia Plan  ASA: II  Anesthesia Plan: Epidural   Post-op Pain Management:    Induction:   Airway Management Planned:   Additional Equipment:   Intra-op Plan:   Post-operative Plan:   Informed Consent: I have reviewed the patients History and Physical, chart, labs and discussed the procedure including the risks, benefits and alternatives for the proposed anesthesia with the patient or authorized representative who has indicated his/her understanding and acceptance.     Plan Discussed with:   Anesthesia Plan Comments:         Anesthesia Quick Evaluation

## 2016-05-23 NOTE — MAU Note (Signed)
ctxs since yesterday afternoon but stronger since 2100. Some bloody show.

## 2016-05-23 NOTE — Progress Notes (Signed)
Interpreter at bedside discussing pain options. Patient decided on epidural.

## 2016-05-23 NOTE — Progress Notes (Signed)
Interpreter called to bedside at 1235 and remained at bedside until now to assist with delivery.

## 2016-05-24 DIAGNOSIS — O135 Gestational [pregnancy-induced] hypertension without significant proteinuria, complicating the puerperium: Secondary | ICD-10-CM

## 2016-05-24 DIAGNOSIS — Z3A40 40 weeks gestation of pregnancy: Secondary | ICD-10-CM

## 2016-05-24 LAB — RPR: RPR Ser Ql: NONREACTIVE

## 2016-05-24 NOTE — Clinical Social Work Maternal (Signed)
  CLINICAL SOCIAL WORK MATERNAL/CHILD NOTE  Patient Details  Name: Erin Henry MRN: [id number] Date of Birth: 11-Nov-1985  Date:  05/24/2016  Clinical Social Worker Initiating Note:  Ferdinand Lango Donnamaria Shands, MSW, LCSW-A  Date/ Time Initiated:  05/24/16/1423     Child's Name:  Unknown to CSW at this time.    Legal Guardian:  Other (Comment) (Not estbalished by court system; MOB and FOB parent collectively )   Need for Interpreter:  Spanish   Date of Referral:  05/23/16     Reason for Referral:  Late or No Prenatal Care    Referral Source:  [facility]   Address:  [address] [place] [address]  Phone number:  [phone]   Household Members:  Self, Significant Other, Minor Children   Natural Supports (not living in the home):  Immediate Family, Extended Family   Professional Supports: None   Employment: Unemployed   Type of Work: Unemployed    Education:  9 to 11 years   Financial Resources:  Self-Pay , Other(Comment) (Plans to apply for Medicaid for she and baby )   Other Resources:      Cultural/Religious Considerations Which May Impact Care:  None reported.   Strengths:  Ability to meet basic needs , Compliance with medical plan , Home prepared for child    Risk Factors/Current Problems:  Transportation    Cognitive State:  Alert , Goal Oriented , Insightful    Mood/Affect:  Calm , Comfortable , Interested    CSW Assessment: CSW met with MOB at bedside to complete assessment. At this time, MOB was in bed playing with baby while older child was laying on the couch watching Tv. This Probation officer explained role and reasoning for visit being due to [facility] @ [redacted]w[redacted]d A this time, MOB was unable to note [facility] MOB notes she does not drive or have a car which sometimes caused her to be stuck at home. This wProbation officerinquired what MOB's plan is for getting to and from medical appointments for baby. MOB noted she lives on the public  transportation bus line which she can utilize. MOB notes she does not have a PCP picked out for baby yet but plans to [place] baby and herself in [place] This wProbation officerprovided MOB with a copy of pediatrician list [place]. Additionally, this wProbation officerprovided MOB with spanish healthy start program brochure. This wProbation officerwill make formal referral upon MOB's request. This wProbation officerdiscussed with MOB hospitals policy and procedure [facility]. This wProbation officerinformed MOB of mandatory report making for positive test results. MOB verbalized understanding.  At this time, no other needs addressed or requested. Case closed to this CSW.   CSW Plan/Description:  Information/Referral to [employer], No Further Intervention Required/No Barriers to Discharge, Other (Comment) (CSW will continue to follow pending cord blood test results and UDS results )    CFerdinand LangoWard, MSW, [facility] Office: [phone]

## 2016-05-24 NOTE — Lactation Note (Signed)
This note was copied from a baby's chart. Lactation Consultation Note: Spanish Interpreter on the phone. ID number [id number], mother states that she breastfeed her other child for 10 months. She is not having andy concerns.Mother request a hand pump that was given to her with instructions. Advised mother to feed infant with all feeding cue and at least 8-12 times in 24 hours. Mother receptive to teaching and states she doesn't have any questions. Mother was given the lactation brochure with community rescources page.  Patient Name: Erin Henry [id number]'JToday's Date: 05/24/2016 Reason for consult: Initial assessment   Maternal Data Has patient been taught Hand Expression?: Yes Does the patient have breastfeeding experience prior to this delivery?: Yes  Feeding Feeding Type: Breast Fed  LATCH Score/Interventions Latch: Grasps breast easily, tongue down, lips flanged, rhythmical sucking.  Audible Swallowing: A few with stimulation  Type of Nipple: Everted at rest and after stimulation  Comfort (Breast/Nipple): Soft / non-tender     Hold (Positioning): No assistance needed to correctly position infant at breast.  LATCH Score: 9  Lactation Tools Discussed/Used     Consult Status Consult Status: Complete    Erin Henry, Erin Henry 05/24/2016, 2:57 PM

## 2016-05-24 NOTE — Anesthesia Postprocedure Evaluation (Signed)
Anesthesia Post Note  Patient: Erin Henry  Procedure(s) Performed: * No procedures listed *  Patient location during evaluation: Mother Baby Anesthesia Type: Epidural Level of consciousness: awake Pain management: pain level controlled Vital Signs Assessment: post-procedure vital signs reviewed and stable Respiratory status: spontaneous breathing Cardiovascular status: stable Postop Assessment: no headache, no backache, epidural receding, patient able to bend at knees and no signs of nausea or vomiting Anesthetic complications: no     Last Vitals:  Vitals:   05/23/16 2100 05/24/16 0515  BP: 110/66 (!) 104/50  Pulse: 75 65  Resp: 18 18  Temp: 36.9 C 36.6 C    Last Pain:  Vitals:   05/24/16 1720  TempSrc:   PainSc: 0-No pain   Pain Goal: Patients Stated Pain Goal: 9 (05/23/16 0743)               Nadiya Pieratt JR,JOHN Susann GivensFRANKLIN

## 2016-05-24 NOTE — Progress Notes (Signed)
Post Partum Day 1 Subjective: no complaints, up ad lib, voiding and tolerating PO  Objective: Blood pressure (!) 104/50, pulse 65, temperature 97.9 F (36.6 C), temperature source Oral, resp. rate 18, height 5\' 1"  (1.549 m), weight 176 lb 12.8 oz (80.2 kg), last menstrual period 08/15/2015, SpO2 98 %, unknown if currently breastfeeding.  Physical Exam:  General: alert, cooperative and no distress Lochia: appropriate Uterine Fundus: firm Incision: n/a DVT Evaluation: No evidence of DVT seen on physical exam. Negative Homan's sign. No cords or calf tenderness. No significant calf/ankle edema.   Recent Labs  05/23/16 0504  HGB 12.7  HCT 37.9    Assessment/Plan: Plan for discharge tomorrow, Breastfeeding and Contraception Nexplanon   LOS: 1 day   LEFTWICH-KIRBY, LISA 05/24/2016, 10:58 AM

## 2016-05-25 NOTE — Discharge Instructions (Signed)
Instrucciones para la mamá sobre los cuidados en el hogar °(Home Care Instructions for Mom) °ACTIVIDAD °· Reanude sus actividades regulares de forma gradual. °· Descanse. Tome siestas cuando el bebé duerme. °· No levante objetos que pesen más de 10 libras (4,5 kg) hasta que el médico se lo autorice. °· Evite las actividades que demandan mucho esfuerzo y energía (que son extenuantes) hasta que el médico se lo autorice. Caminar a un ritmo tranquilo a moderado siempre es más seguro. °· Si tuvo un parto por cesárea: °? No pase la aspiradora, suba escaleras o conduzca un vehículo durante 4 o 6 semanas. °? Pídale a alguien que le brinde ayuda con las tareas domésticas hasta que pueda realizarlas por su cuenta. °? Haga ejercicios como se lo haya indicado el médico, si corresponde. ° °HEMORRAGIA VAGINAL °Probablemente continúe sangrando durante 4 o 6 semanas después del parto. Generalmente, la cantidad de sangre disminuye y el color se hace más claro con el transcurso del tiempo. Sin embargo, si usted está demasiado activa, el color de la sangre puede ser rojo brillante. Si necesita cambiarse la compresa higiénica en menos de una hora o tiene coágulos grandes: °· Permanezca acostada. °· Eleve los pies. °· Coloque compresas frías en la zona inferior del abdomen. °· Haga reposo. °· Comuníquese con su médico. °Si está amamantando, podría volver a tener su período entre las 8 semanas después del parto y el momento en que deje de amamantar. Si no está amamantando, volverá a tener su período 6 u 8 semanas después del parto. °CUIDADOS PERINEALES °La zona perineal o perineo, es la parte del cuerpo que se encuentra entre los muslos. Después del parto, esta zona necesita un cuidado especial. Siga las siguientes indicaciones como se lo haya indicado su médico. °· Tome baños de inmersión durante 15 o 20 minutos. °· Utilice apósitos o aerosoles analgésicos y cremas como se lo hayan indicado. °· No utilice tampones ni se haga duchas  vaginales hasta que el sangrado vaginal se haya detenido. °· Cada vez que vaya al baño: °? Use una botella perineal. °? Cámbiese el apósito. °? Use papel tisú en lugar de papel higiénico hasta que se cure la sutura. °· Haga ejercicios de Kegel todos los días. Los ejercicios Kegel ayudan a mantener los músculos que sostienen la vagina, la vejiga y los intestinos. Estos ejercicios se pueden realizar mientras está parada, sentada o acostada. Para hacer los ejercicios de Kegel: °? Tense los músculos del estómago y los que rodean el canal de parto. °? Mantenga esta posición durante unos segundos. °? Relájese. °? Repita hasta hacerlos 5 veces seguidas. °· Para evitar las hemorroides o que estas empeoren: °? Beba suficiente líquido para mantener la orina clara o de color amarillo pálido. °? Evite hacer fuerza al defecar. °? Tome los medicamentos y laxantes de venta libre como se lo haya indicado el médico. °CUIDADO DE LAS MAMAS °· Use un buen sostén. °· Evite tomar analgésicos de venta libre para las molestias de los pechos. °· Aplique hielo en los pechos para aliviar las molestias tanto como sea necesario: °? Ponga el hielo en una bolsa plástica. °? Coloque una toalla entre la piel y la bolsa de hielo. °? Aplique el hielo durante 20, o como se lo haya indicado el médico. ° °NUTRICIÓN °· Mantenga una dieta bien balanceada. °· No intente perder de peso rápidamente reduciendo el consumo de calorías. °· Tome sus vitaminas prenatales hasta el control de postparto o hasta que su médico se lo indique. ° °DEPRESIÓN POSTPARTO °  Puede sentir deseos de llorar sin motivo aparente y verse incapaz de enfrentarse a todos los cambios que implica tener un bebé. Este estado de ánimo se llama depresión postparto. La depresión postparto ocurre porque sus niveles hormonales sufren cambios después del parto. Si usted tiene depresión postparto, busque contención por parte de su pareja, sus amigos y su familia. Si la depresión no desaparece por  sí sola después de algunas semanas, concurra a su médico. °AUTOEXAMEN DE MAMAS °Realícese autoexámenes en el mismo momento cada mes. Si está amamantando, el mejor momento de controlar sus mamas es después de alimentar al bebé, cuando los pechos no están tan llenos. Si está amamantando y su período ya comenzó, controle sus mamas el día 5, 6 o 7 de su período. °Informe a su médico de cualquier protuberancia, bulto o secreción. Si está amamantando, las mamas normalmente tienen bultos. Esto es transitorio y no es un riesgo para la salud. °INTIMIDAD Y SEXUALIDAD °Debe evitar las relaciones sexuales durante al menos 3 o 4 semanas después del parto o hasta que el flujo de color rojo amarronado haya desaparecido completamente. Si no desea quedar embarazada nuevamente, use algún método anticonceptivo. Después del parto, puede quedar embarazada incluso si no ha tenido todavía el período. °SOLICITE ATENCIÓN MÉDICA SI: °· Se siente incapaz de controlar los cambios que implica tener un hijo y esos sentimientos no desaparecen después de algunas semanas. °· Detecta una protuberancia, bulto o secreción en sus mamas. ° °SOLICITE ATENCIÓN MÉDICA DE INMEDIATO SI: °· Debe cambiarse la compresa higiénica en 1 hora o menos. °· Tiene los siguientes síntomas: °? Dolor intenso o calambres en la parte inferior del abdomen. °? Una secreción vaginal con mal olor. °? Fiebre que no se alivia con los medicamentos. °? Una zona de la mama se pone roja y le causa dolor, y además usted tiene fiebre. °? Una pantorrilla enrojecida y con dolor. °? Repentino e intenso dolor en el pecho. °? Falta de aire. °? Micción dolorosa o con sangre. °? Problemas visuales. °· Vómitos durante 12 horas o más. °· Dolor de cabeza intenso. °· Tiene pensamientos serios acerca de lastimarse a usted misma o dañar al niño o a otra persona. ° °Esta información no tiene como fin reemplazar el consejo del médico. Asegúrese de hacerle al médico cualquier pregunta que  tenga. °Document Released: 06/01/2005 Document Revised: 09/23/2015 Document Reviewed: 12/03/2014 °Elsevier Interactive Patient Education © 2017 Elsevier Inc. ° °

## 2016-05-25 NOTE — Lactation Note (Signed)
This note was copied from a baby's chart. Lactation Consultation Note  Copyacifica Interpreter used for [place]. Mother states she has no nipples.  Mother undressed and revealed short shaft nipples. Mother then latched baby with sucks and swallows easily. Demonstrated how to achieve a deeper latch and latch further on areola. Reviewed pg 24 chart for voids/stools. Encouraged breastfeeding before offering formula.  Discussed supply and demand. Mom encouraged to feed baby 8-12 times/24 hours and with feeding cues.  Reviewed engorgement care and monitoring voids/stools. Discussed prepumping to help evert nipples and mother states she has been told.  Patient Name: Girl Augustine Radareresa Gamez-Escobar BMWUX'LToday's Date: 05/25/2016 Reason for consult: Follow-up assessment   Maternal Data    Feeding Feeding Type: Bottle Fed - Formula Length of feed: 15 min  LATCH Score/Interventions Latch: Grasps breast easily, tongue down, lips flanged, rhythmical sucking.  Audible Swallowing: A few with stimulation  Type of Nipple: Everted at rest and after stimulation (short shaft)  Comfort (Breast/Nipple): Soft / non-tender     Hold (Positioning): Assistance needed to correctly position infant at breast and maintain latch.  LATCH Score: 8  Lactation Tools Discussed/Used     Consult Status Consult Status: Complete    Hardie PulleyBerkelhammer, Ruth Boschen 05/25/2016, 12:27 PM

## 2016-05-25 NOTE — Discharge Summary (Signed)
OB Discharge Summary     Patient Name: Erin Henry DOB: [birth year] MRN: [id number]  Date of admission: 05/23/2016 Delivering MD: Aviva SignsWILLIAMS, MARIE L   Date of discharge: 05/25/2016  Admitting diagnosis: 40 weeks in pain and labor Intrauterine pregnancy: [redacted]w[redacted]d     Secondary diagnosis:  Active Problems:   Indication for care in labor or delivery  Additional problems: none     Discharge diagnosis: Term Pregnancy Delivered                                                                                                Post partum procedures:none  Augmentation: none  Complications: None  Hospital course:  Onset of Labor With Vaginal Delivery     30 y.o. yo [id number] at 481w2d was admitted in Active Labor on 05/23/2016. Patient had an uncomplicated labor course as follows:  Membrane Rupture Time/Date: 12:42 PM ,05/23/2016   Intrapartum Procedures: Episiotomy: None [1]                                         Lacerations:  2nd degree [3]  Patient had a delivery of a Viable infant. 05/23/2016  Information for the patient's newborn:  Erin Henry, Erin Henry [[id number]][[id number]]  Delivery Method: Vag-Spont    Pateint had an uncomplicated postpartum course.  She is ambulating, tolerating a regular diet, passing flatus, and urinating well. Patient is discharged home in stable condition on 05/25/16.    Physical exam Vitals:   05/23/16 1700 05/23/16 2100 05/24/16 0515 05/25/16 0543  BP: 118/71 110/66 (!) 104/50 114/62  Pulse: 87 75 65 73  Resp: 16 18 18 18   Temp: 99.5 F (37.5 C) 98.5 F (36.9 C) 97.9 F (36.6 C) 98.2 F (36.8 C)  TempSrc: Oral Oral Oral Oral  SpO2: 98%     Weight:      Height:       General: alert, cooperative and no distress Lochia: appropriate Uterine Fundus: firm Incision: N/A DVT Evaluation: No evidence of DVT seen on physical exam. Labs: Lab Results  Component Value Date   WBC 10.9 (H) 05/23/2016   HGB 12.7 05/23/2016   HCT 37.9 05/23/2016    MCV 85.9 05/23/2016   PLT 216 05/23/2016   CMP Latest Ref Rng & Units 05/23/2016  Glucose 65 - 99 mg/dL 130(Q102(H)  BUN 6 - 20 mg/dL 12  Creatinine 6.570.44 - 8.461.00 mg/dL 9.620.49  Sodium 952135 - 841145 mmol/L 134(L)  Potassium 3.5 - 5.1 mmol/L 4.3  Chloride 101 - 111 mmol/L 104  CO2 22 - 32 mmol/L 19(L)  Calcium 8.9 - 10.3 mg/dL 8.9  Total Protein 6.5 - 8.1 g/dL 6.7  Total Bilirubin 0.3 - 1.2 mg/dL 0.5  Alkaline Phos 38 - 126 U/L 270(H)  AST 15 - 41 U/L 39  ALT 14 - 54 U/L 55(H)    Discharge instruction: per After Visit Summary and "Baby and Me Booklet".  After visit meds:    Medication List    TAKE these medications  multivitamin-prenatal 27-0.8 MG Tabs tablet Take 1 tablet by mouth daily at 12 noon.       Diet: routine diet  Activity: Advance as tolerated. Pelvic rest for 6 weeks.   Outpatient follow up:6 weeks Follow up Appt:Future Appointments Date Time Provider Department Center  05/27/2016 9:40 AM Dorathy KinsmanVirginia Smith, CNM [facility]   Follow up Visit:No Follow-up on file.  Postpartum contraception: Nexplanon  Newborn Data: Live born female  Birth Weight: 7 lb 7.6 oz (3390 g) APGAR: 9, 9  Baby Feeding: Bottle and Breast Disposition:home with mother   05/25/2016 Clearance CootsAndrew Tyson, MD   OB FELLOW DISCHARGE ATTESTATION  I have seen and examined this patient and agree with above documentation in the resident's note.   Ernestina PennaNicholas Schenk, MD 9:29 AM

## 2016-05-27 ENCOUNTER — Encounter: Payer: Self-pay | Admitting: Advanced Practice Midwife

## 2016-06-10 ENCOUNTER — Encounter: Payer: Self-pay | Admitting: Advanced Practice Midwife

## 2016-07-06 ENCOUNTER — Encounter: Payer: Self-pay | Admitting: Advanced Practice Midwife

## 2016-07-06 ENCOUNTER — Ambulatory Visit (INDEPENDENT_AMBULATORY_CARE_PROVIDER_SITE_OTHER): Payer: Self-pay | Admitting: Advanced Practice Midwife

## 2016-07-06 DIAGNOSIS — Z3009 Encounter for other general counseling and advice on contraception: Secondary | ICD-10-CM

## 2016-07-06 NOTE — Progress Notes (Signed)
Subjective:     Erin Henry is a 31 y.o. female who presents for a postpartum visit. She is 6 weeks postpartum following a spontaneous vaginal delivery. I have fully reviewed the prenatal and intrapartum course. The delivery was at 40 gestational weeks. Outcome: spontaneous vaginal delivery. Anesthesia: epidural. Postpartum course has been unremarkable. Baby's course has been unremarkable. Baby is feeding by both breast and bottle - Similac Advance. Bleeding no bleeding. Bowel function is abnormal: constipation. Bladder function is normal. Patient is not sexually active. Contraception method is none. Wants Nexplanon. Postpartum depression screening: negative.  Spanish interpreter "Okey RegalCarol" present for visit.  The following portions of the patient's history were reviewed and updated as appropriate: allergies, current medications, past family history, past medical history, past social history, past surgical history and problem list.  Review of Systems Pertinent items are noted in HPI.   Objective:    BP (!) 129/91 (BP Location: Right Arm, Patient Position: Sitting, Cuff Size: Large)   Pulse 69   Wt 165 lb (74.8 kg)   Breastfeeding? Yes   BMI 31.18 kg/m   General:  alert, cooperative, appears stated age, no distress and moderately obese   Breasts:  Declined  Lungs: clear to auscultation bilaterally  Heart:  regular rate and rhythm, S1, S2 normal, no murmur, click, rub or gallop  Abdomen: soft, non-tender; bowel sounds normal; no masses,  no organomegaly   Vulva:  Declined  Rectal Exam: Not performed.        Assessment:     Normal postpartum exam. Pap smear not done at today's visit.  Contraceptive counseling done. Unable to afford Nexplanon (Self Pay)  Plan:    1. Contraception: condoms for now. IUD scholarship form given, but pt prefers Nexplanon. 2. [facility]  3. Follow up in: 1 year or as needed.     [place] Azarius Lambson, CNM 07/06/2016 10:03 AM

## 2016-07-06 NOTE — Patient Instructions (Signed)
[facility] [address]  [place], [place]  [id number]  Eleccin del mtodo anticonceptivo (Contraception Choices) La anticoncepcin (control de la natalidad) es el uso de cualquier mtodo o dispositivo para Location managerevitar el embarazo. A continuacin se indican algunos de esos mtodos. MTODOS HORMONALES  El Implante contraconceptivo consiste en un tubo plstico delgado que contiene la hormona progesterona. No contiene estrgenos. El mdico inserta el tubo en la parte interna del brazo. El tubo puede Geneticist, molecularpermanecer en el lugar durante 3 aos. Despus de los 3 aos debe retirarse. El implante impide que los ovarios liberen vulos (ovulacin), espesa el moco cervical, lo que evita que los espermatozoides ingresen al tero y hace ms delgada la membrana que cubre el interior del tero.  Inyecciones de progesterona sola: las Insurance underwriteradministra el mdico cada 3 meses para Location managerevitar el embarazo. La progesterona sinttica impide que los ovarios liberen vulos. Tambin hacen que el moco cervical se espese y modifique el tejido de recubrimiento interno del tero. Esto hace ms difcil que los espermatozoides sobrevivan en el tero.  Las pldoras anticonceptivas contienen estrgenos y Education officer, museumprogesterona. Su funcin es ALLTEL Corporationevitar que los ovarios liberen vulos (ovulacin). Las hormonas de los anticonceptivos orales hacen que el moco cervical se haga ms espeso, lo que evita que el esperma ingrese al tero. Las pldoras anticonceptivas son recetadas por el mdico.Tambin se utilizan para tratar los perodos menstruales abundantes.  Minipldora: este tipo de pldora anticonceptiva contiene slo hormona progesterona. Deben tomarse todos los [address]  [address] del mes y debe recetarlas el mdico.  El parche de control de natalidad: contiene hormonas similares a las que contienen las pldoras anticonceptivas. Deben cambiarse una vez por semana y se utilizan bajo prescripcin mdica.  Anillo vaginal: contiene hormonas similares a las que contienen  las pldoras anticonceptivas. Se deja colocado durante tres semanas, se lo retira durante 1 semana y luego se coloca uno nuevo. La paciente debe sentirse cmoda al insertar y retirar el anillo de la vagina.Es necesaria la prescripcin mdica.  Anticonceptivos de emergencia: son mtodos para evitar un embarazo despus de Neomia Dearuna relacin sexual sin proteccin. Esta pldora puede tomarse inmediatamente despus de Child psychotherapisttener relaciones sexuales o hasta 5 [place] de haber tenido sexo sin proteccin. Es ms efectiva si se toma poco tiempo despus de la relacin sexual. Los anticonceptivos de emergencia estn disponibles sin prescripcin mdica. Consltelo con su farmacutico. No use los anticonceptivos de emergencia como nico mtodo anticonceptivo. MTODOS DE Lenis NoonBARRERA  Condn masculino: es una vaina delgada (ltex o goma) que se coloca cubriendo al pene durante el acto sexual. Deri Fuellinguede usarse con espermicida para aumentar la efectividad.  Condn femenino. Es una funda delicada y blanda que se adapta holgadamente a la vagina antes de las Clinical research associaterelaciones sexuales.  Diafragma: es una barrera de ltex redonda y suave que debe ser recomendado por un profesional. Se inserta en la vagina, junto con un gel espermicida. Debe insertarse antes de Management consultanttener relaciones sexuales. Debe dejar el diafragma colocado en la vagina durante 6 a 8 horas despus de la relacin sexual.  Capuchn cervical: es una barrera de ltex o taza plstica redonda y Bahamassuave que cubre el cuello del tero y debe ser colocada por un mdico. Puede dejarlo colocado en la vagina hasta 48 horas despus de las Clinical research associaterelaciones sexuales.  Esponja: es una pieza blanda y circular de espuma de poliuretano. Contiene un espermicida. Se inserta en la vagina despus de mojarla y antes de las The St. Paul Travelersrelaciones sexuales.  Espermicidas: son sustancias qumicas que matan o bloquean al esperma y no lo dejan  ingresar al cuello del tero y al tero. Vienen en forma de cremas, geles, supositorios,  espuma o comprimidos. No es necesario tener Emergency planning/management officer. Se insertan en la vagina con un aplicador antes de Management consultant. El proceso debe repetirse cada vez que tiene relaciones sexuales. ANTICONCEPTIVOS INTRAUTERINOS  Dispositivo intrauterino (DIU) es un dispositivo en forma de T que se coloca en el tero durante el perodo menstrual, para Location manager. Hay dos tipos:  DIU de cobre: este tipo de DIU est recubierto con un alambre de cobre y se inserta dentro del tero. El cobre hace que el tero y las trompas de Falopio produzcan un liquido que [employer] espermatozoides. Puede permanecer colocado durante 10 aos.  DIU con hormona: este tipo de DIU contiene la hormona progestina (progesterona sinttica). La hormona espesa el moco cervical y evita que los espermatozoides ingresen al tero y tambin afina la membrana que cubre el tero para evitar la implantacin del vulo fertilizado. La hormona debilita o destruye los espermatozoides que ingresan al tero. Puede Geneticist, molecular durante 3-5 aos, segn el tipo de DIU que se [place]. MTODOS ANTICONCEPTIVOS PERMANENTES  Ligadura de trompas en la mujer: se realiza sellando, atando u obstruyendo quirrgicamente las trompas de Falopio lo que impide que el vulo descienda hacia el tero.  Esterilizacin histeroscpica: Implica la colocacin de un pequeo espiral o la insercin en cada trompa de Falopio. El mdico utiliza una tcnica llamada histeroscopa para Primary school teacher procedimiento. El dispositivo produce la formacin de tejido Designer, television/film set. Esto da como resultado una obstruccin permanente de las trompas de Falopio, de modo que la esperma no pueda fertilizar el vulo. Demora alrededor de 3 meses despus del procedimiento hasta que el conducto se obstruye. Tendr que usar otro mtodo anticonceptivo durante al menos 3 meses.  Esterilizacin masculina: se realiza ligando los conductos por los que pasan los espermatozoides  (vasectoma).Esto impide que el esperma ingrese a la vagina durante el acto sexual. Luego del procedimiento, el hombre puede eyacular lquido (semen). MTODOS DE PLANIFICACIN NATURAL  Planificacin familiar natural: consiste en no Management consultant o usar un mtodo de barrera (condn, [place], capuchn cervical) en los [employer] la mujer podra quedar [place].  Mtodo de calendario: consiste en el seguimiento de la duracin de cada ciclo menstrual y la identificacin de los perodos frtiles.  Mtodo de ovulacin: Paramedic las relaciones sexuales durante la ovulacin.  Mtodo sintotrmico: Advertising copywriter sexuales en la poca en la que se est ovulando, utilizando un termmetro y tendiendo en cuenta los sntomas de la ovulacin.  Mtodo postovulacin: Youth worker las relaciones sexuales para despus de haber ovulado. Independientemente del tipo o mtodo anticonceptivo que usted elija, es importante que use condones para protegerse contra las infecciones de transmisin sexual (ETS). Hable con su mdico con respecto a qu mtodo anticonceptivo es el ms apropiado para usted. Esta informacin no tiene Theme park manager el consejo del mdico. Asegrese de hacerle al mdico cualquier pregunta que tenga. Document Released: 06/01/2005 Document Revised: 02/01/2013 Document Reviewed: 11/24/2012 Elsevier Interactive Patient Education  2017 [employer].

## 2019-09-15 ENCOUNTER — Ambulatory Visit: Payer: Self-pay | Attending: Internal Medicine

## 2019-09-15 DIAGNOSIS — Z23 Encounter for immunization: Secondary | ICD-10-CM

## 2019-09-15 NOTE — Progress Notes (Signed)
   Covid-19 Vaccination Clinic  Name:  Erin Henry    MRN: [id number] DOB: Sep 08, 1985  09/15/2019  [name] was observed post Covid-19 immunization for 15 minutes without incident. She was provided with Vaccine Information Sheet and instruction to access the V-Safe system.   [name] was instructed to call 911 with any severe reactions post vaccine: Marland Kitchen Difficulty breathing  . Swelling of face and throat  . A fast heartbeat  . A bad rash all over body  . Dizziness and weakness   Immunizations Administered    Name Date Dose VIS Date Route   Pfizer COVID-19 Vaccine 09/15/2019  2:18 PM 0.3 mL 05/26/2019 Intramuscular   Manufacturer: [employer], [employer]   Lot: [id number]   NDC: [address]-8

## 2019-10-09 ENCOUNTER — Ambulatory Visit: Payer: Self-pay | Attending: Internal Medicine

## 2023-09-09 ENCOUNTER — Encounter (HOSPITAL_COMMUNITY): Admission: EM | Disposition: A | Discharge status: Home / Self Care | Payer: Self-pay | Source: Home / Self Care

## 2023-09-09 ENCOUNTER — Emergency Department (HOSPITAL_COMMUNITY): Payer: MEDICAID | Admitting: Anesthesiology

## 2023-09-09 ENCOUNTER — Inpatient Hospital Stay (HOSPITAL_COMMUNITY)
Admission: EM | Admit: 2023-09-09 | Discharge: 2023-09-12 | DRG: 419 | Disposition: A | Discharge status: Home / Self Care | Payer: MEDICAID | Attending: General Surgery | Admitting: General Surgery

## 2023-09-09 ENCOUNTER — Other Ambulatory Visit: Payer: Self-pay

## 2023-09-09 ENCOUNTER — Emergency Department (HOSPITAL_COMMUNITY): Payer: MEDICAID

## 2023-09-09 ENCOUNTER — Encounter (HOSPITAL_COMMUNITY): Payer: Self-pay | Admitting: General Surgery

## 2023-09-09 ENCOUNTER — Inpatient Hospital Stay (HOSPITAL_COMMUNITY): Admission: AD | Admit: 2023-09-09 | Payer: Self-pay | Admitting: Obstetrics and Gynecology

## 2023-09-09 DIAGNOSIS — K8000 Calculus of gallbladder with acute cholecystitis without obstruction: Principal | ICD-10-CM | POA: Diagnosis present

## 2023-09-09 DIAGNOSIS — Z603 Acculturation difficulty: Secondary | ICD-10-CM | POA: Diagnosis present

## 2023-09-09 DIAGNOSIS — K8063 Calculus of gallbladder and bile duct with acute cholecystitis with obstruction: Principal | ICD-10-CM | POA: Diagnosis present

## 2023-09-09 DIAGNOSIS — K8050 Calculus of bile duct without cholangitis or cholecystitis without obstruction: Secondary | ICD-10-CM

## 2023-09-09 DIAGNOSIS — K810 Acute cholecystitis: Secondary | ICD-10-CM

## 2023-09-09 DIAGNOSIS — Z833 Family history of diabetes mellitus: Secondary | ICD-10-CM

## 2023-09-09 HISTORY — PX: CHOLECYSTECTOMY: SHX55

## 2023-09-09 LAB — COMPREHENSIVE METABOLIC PANEL WITH GFR
ALT: 27 U/L (ref 0–44)
AST: 22 U/L (ref 15–41)
Albumin: 3.9 g/dL (ref 3.5–5.0)
Alkaline Phosphatase: 98 U/L (ref 38–126)
Anion gap: 10 (ref 5–15)
BUN: 20 mg/dL (ref 6–20)
CO2: 22 mmol/L (ref 22–32)
Calcium: 8.8 mg/dL — ABNORMAL LOW (ref 8.9–10.3)
Chloride: 103 mmol/L (ref 98–111)
Creatinine, Ser: 0.58 mg/dL (ref 0.44–1.00)
GFR, Estimated: >60 mL/min (ref >60)
Glucose, Bld: 128 mg/dL — ABNORMAL HIGH (ref 70–99)
Potassium: 3.6 mmol/L (ref 3.5–5.1)
Sodium: 135 mmol/L (ref 135–145)
Total Bilirubin: 0.7 mg/dL (ref 0.0–1.2)
Total Protein: 7.2 g/dL (ref 6.5–8.1)

## 2023-09-09 LAB — CBC WITH DIFFERENTIAL/PLATELET
Abs Immature Granulocytes: 0.05 10*3/uL (ref 0.00–0.07)
Basophils Absolute: 0.1 10*3/uL (ref 0.0–0.1)
Basophils Relative: 1 %
Eosinophils Absolute: 0.1 10*3/uL (ref 0.0–0.5)
Eosinophils Relative: 1 %
HCT: 38 % (ref 36.0–46.0)
Hemoglobin: 12.9 g/dL (ref 12.0–15.0)
Immature Granulocytes: 0 %
Lymphocytes Relative: 18 %
Lymphs Abs: 2.3 10*3/uL (ref 0.7–4.0)
MCH: 29.3 pg (ref 26.0–34.0)
MCHC: 33.9 g/dL (ref 30.0–36.0)
MCV: 86.4 fL (ref 80.0–100.0)
Monocytes Absolute: 0.7 10*3/uL (ref 0.1–1.0)
Monocytes Relative: 6 %
Neutro Abs: 9.6 10*3/uL — ABNORMAL HIGH (ref 1.7–7.7)
Neutrophils Relative %: 74 %
Platelets: 267 10*3/uL (ref 150–400)
RBC: 4.4 MIL/uL (ref 3.87–5.11)
RDW: 12.2 % (ref 11.5–15.5)
WBC: 12.8 10*3/uL — ABNORMAL HIGH (ref 4.0–10.5)
nRBC: 0 % (ref 0.0–0.2)

## 2023-09-09 LAB — LIPASE, BLOOD: Lipase: 29 U/L (ref 11–51)

## 2023-09-09 LAB — HCG, SERUM, QUALITATIVE: Preg, Serum: NEGATIVE

## 2023-09-09 SURGERY — LAPAROSCOPIC CHOLECYSTECTOMY WITH INTRAOPERATIVE CHOLANGIOGRAM
Anesthesia: General

## 2023-09-09 MED ORDER — DOCUSATE SODIUM 100 MG PO CAPS
100.0000 mg | ORAL_CAPSULE | Freq: Two times a day (BID) | ORAL | Status: DC
  Administered 2023-09-09 – 2023-09-12 (×6): 100 mg via ORAL
  Filled 2023-09-09 (×6): qty 1

## 2023-09-09 MED ORDER — FENTANYL CITRATE (PF) 250 MCG/5ML IJ SOLN
INTRAMUSCULAR | Status: AC
  Filled 2023-09-09: qty 5

## 2023-09-09 MED ORDER — LIDOCAINE 2% (20 MG/ML) 5 ML SYRINGE
INTRAMUSCULAR | Status: DC | PRN
  Administered 2023-09-09: 60 mg via INTRAVENOUS

## 2023-09-09 MED ORDER — LACTATED RINGERS IV SOLN: INTRAVENOUS | Status: DC

## 2023-09-09 MED ORDER — BUPIVACAINE-EPINEPHRINE (PF) 0.25% -1:200000 IJ SOLN
INTRAMUSCULAR | Status: AC
  Filled 2023-09-09: qty 30

## 2023-09-09 MED ORDER — PROPOFOL 10 MG/ML IV BOLUS
INTRAVENOUS | Status: DC | PRN
  Administered 2023-09-09: 150 mg via INTRAVENOUS

## 2023-09-09 MED ORDER — MELATONIN 3 MG PO TABS: 3.0000 mg | ORAL_TABLET | Freq: Every evening | ORAL | Status: DC | PRN

## 2023-09-09 MED ORDER — ONDANSETRON HCL 4 MG/2ML IJ SOLN
INTRAMUSCULAR | Status: AC
  Filled 2023-09-09: qty 2

## 2023-09-09 MED ORDER — SIMETHICONE 80 MG PO CHEW: 40.0000 mg | CHEWABLE_TABLET | Freq: Four times a day (QID) | ORAL | Status: DC | PRN

## 2023-09-09 MED ORDER — CELECOXIB 200 MG PO CAPS
ORAL_CAPSULE | ORAL | Status: AC
Start: 2023-09-09 — End: 2023-09-09
  Filled 2023-09-09: qty 1

## 2023-09-09 MED ORDER — SODIUM CHLORIDE 0.9 % IV SOLN
2.0000 g | Freq: Once | INTRAVENOUS | Status: DC
  Filled 2023-09-09: qty 20

## 2023-09-09 MED ORDER — ONDANSETRON HCL 4 MG/2ML IJ SOLN
4.0000 mg | Freq: Four times a day (QID) | INTRAMUSCULAR | Status: DC | PRN
Start: 2023-09-09 — End: 2023-09-12
  Administered 2023-09-09: 4 mg via INTRAVENOUS
  Filled 2023-09-09: qty 2

## 2023-09-09 MED ORDER — OXYCODONE HCL 5 MG PO TABS: 5.0000 mg | ORAL_TABLET | Freq: Once | ORAL | Status: DC | PRN

## 2023-09-09 MED ORDER — BUPIVACAINE-EPINEPHRINE 0.25% -1:200000 IJ SOLN
INTRAMUSCULAR | Status: DC | PRN
  Administered 2023-09-09: 15 mL

## 2023-09-09 MED ORDER — OXYCODONE HCL 5 MG PO TABS
5.0000 mg | ORAL_TABLET | ORAL | Status: DC | PRN
  Administered 2023-09-09: 5 mg via ORAL
  Administered 2023-09-10 (×2): 10 mg via ORAL
  Filled 2023-09-09: qty 2
  Filled 2023-09-09: qty 1
  Filled 2023-09-09: qty 2

## 2023-09-09 MED ORDER — ACETAMINOPHEN 10 MG/ML IV SOLN
1000.0000 mg | Freq: Once | INTRAVENOUS | Status: DC | PRN
  Administered 2023-09-09: 1000 mg via INTRAVENOUS

## 2023-09-09 MED ORDER — POLYETHYLENE GLYCOL 3350 17 G PO PACK
17.0000 g | PACK | Freq: Every day | ORAL | Status: DC | PRN
  Administered 2023-09-10: 17 g via ORAL
  Filled 2023-09-09: qty 1

## 2023-09-09 MED ORDER — BISACODYL 5 MG PO TBEC: 5.0000 mg | DELAYED_RELEASE_TABLET | Freq: Every day | ORAL | Status: DC | PRN

## 2023-09-09 MED ORDER — OXYCODONE HCL 5 MG PO TABS
ORAL_TABLET | ORAL | Status: AC
  Filled 2023-09-09: qty 1

## 2023-09-09 MED ORDER — ROCURONIUM BROMIDE 10 MG/ML (PF) SYRINGE
PREFILLED_SYRINGE | INTRAVENOUS | Status: DC | PRN
Start: 2023-09-09 — End: 2023-09-09
  Administered 2023-09-09: 50 mg via INTRAVENOUS

## 2023-09-09 MED ORDER — SUCCINYLCHOLINE CHLORIDE 200 MG/10ML IV SOSY
PREFILLED_SYRINGE | INTRAVENOUS | Status: AC
  Filled 2023-09-09: qty 10

## 2023-09-09 MED ORDER — SCOPOLAMINE 1 MG/3DAYS TD PT72
1.0000 | MEDICATED_PATCH | TRANSDERMAL | Status: DC
  Administered 2023-09-09: 1.5 mg via TRANSDERMAL

## 2023-09-09 MED ORDER — 0.9 % SODIUM CHLORIDE (POUR BTL) OPTIME
TOPICAL | Status: DC | PRN
  Administered 2023-09-09: 1000 mL

## 2023-09-09 MED ORDER — CHLORHEXIDINE GLUCONATE 0.12 % MT SOLN
OROMUCOSAL | Status: AC
Start: 2023-09-09 — End: 2023-09-09
  Administered 2023-09-09: 15 mL via OROMUCOSAL
  Filled 2023-09-09: qty 15

## 2023-09-09 MED ORDER — DIPHENHYDRAMINE HCL 50 MG/ML IJ SOLN: 25.0000 mg | Freq: Four times a day (QID) | INTRAMUSCULAR | Status: DC | PRN

## 2023-09-09 MED ORDER — ACETAMINOPHEN 10 MG/ML IV SOLN
INTRAVENOUS | Status: AC
  Filled 2023-09-09: qty 100

## 2023-09-09 MED ORDER — PRENATAL MULTIVITAMIN CH
1.0000 | ORAL_TABLET | Freq: Every day | ORAL | Status: DC
  Administered 2023-09-10 – 2023-09-12 (×3): 1 via ORAL
  Filled 2023-09-09 (×3): qty 1

## 2023-09-09 MED ORDER — SCOPOLAMINE 1 MG/3DAYS TD PT72
MEDICATED_PATCH | TRANSDERMAL | Status: AC
  Filled 2023-09-09: qty 1

## 2023-09-09 MED ORDER — METHOCARBAMOL 1000 MG/10ML IJ SOLN: 500.0000 mg | Freq: Three times a day (TID) | INTRAMUSCULAR | Status: DC | PRN

## 2023-09-09 MED ORDER — ONDANSETRON HCL 4 MG/2ML IJ SOLN
4.0000 mg | Freq: Once | INTRAMUSCULAR | Status: AC
  Administered 2023-09-09: 4 mg via INTRAVENOUS
  Filled 2023-09-09: qty 2

## 2023-09-09 MED ORDER — FENTANYL CITRATE (PF) 100 MCG/2ML IJ SOLN
25.0000 ug | INTRAMUSCULAR | Status: DC | PRN
  Administered 2023-09-09: 25 ug via INTRAVENOUS
  Administered 2023-09-09: 50 ug via INTRAVENOUS
  Administered 2023-09-09: 25 ug via INTRAVENOUS

## 2023-09-09 MED ORDER — CHLORHEXIDINE GLUCONATE 0.12 % MT SOLN: 15.0000 mL | Freq: Once | OROMUCOSAL | Status: AC

## 2023-09-09 MED ORDER — AMISULPRIDE (ANTIEMETIC) 5 MG/2ML IV SOLN
INTRAVENOUS | Status: AC
  Filled 2023-09-09: qty 4

## 2023-09-09 MED ORDER — HYDROMORPHONE HCL 1 MG/ML IJ SOLN
0.5000 mg | Freq: Once | INTRAMUSCULAR | Status: AC
  Administered 2023-09-09: 0.5 mg via INTRAVENOUS
  Filled 2023-09-09: qty 1

## 2023-09-09 MED ORDER — LIDOCAINE 2% (20 MG/ML) 5 ML SYRINGE
INTRAMUSCULAR | Status: AC
  Filled 2023-09-09: qty 5

## 2023-09-09 MED ORDER — ACETAMINOPHEN 500 MG PO TABS
1000.0000 mg | ORAL_TABLET | Freq: Four times a day (QID) | ORAL | Status: DC
  Administered 2023-09-09 – 2023-09-12 (×11): 1000 mg via ORAL
  Filled 2023-09-09 (×12): qty 2

## 2023-09-09 MED ORDER — PHENYLEPHRINE 80 MCG/ML (10ML) SYRINGE FOR IV PUSH (FOR BLOOD PRESSURE SUPPORT)
PREFILLED_SYRINGE | INTRAVENOUS | Status: DC | PRN
  Administered 2023-09-09 (×2): 80 ug via INTRAVENOUS

## 2023-09-09 MED ORDER — SODIUM CHLORIDE 0.9 % IR SOLN
Status: DC | PRN
  Administered 2023-09-09: 1000 mL

## 2023-09-09 MED ORDER — ORAL CARE MOUTH RINSE: 15.0000 mL | Freq: Once | OROMUCOSAL | Status: AC

## 2023-09-09 MED ORDER — FENTANYL CITRATE (PF) 100 MCG/2ML IJ SOLN
INTRAMUSCULAR | Status: AC
  Filled 2023-09-09: qty 2

## 2023-09-09 MED ORDER — ROCURONIUM BROMIDE 10 MG/ML (PF) SYRINGE
PREFILLED_SYRINGE | INTRAVENOUS | Status: AC
  Filled 2023-09-09: qty 10

## 2023-09-09 MED ORDER — DEXAMETHASONE SODIUM PHOSPHATE 10 MG/ML IJ SOLN
INTRAMUSCULAR | Status: DC | PRN
Start: 2023-09-09 — End: 2023-09-09
  Administered 2023-09-09: 10 mg via INTRAVENOUS

## 2023-09-09 MED ORDER — ONDANSETRON 4 MG PO TBDP: 4.0000 mg | ORAL_TABLET | Freq: Four times a day (QID) | ORAL | Status: DC | PRN

## 2023-09-09 MED ORDER — SODIUM CHLORIDE 0.9 % IV SOLN: INTRAVENOUS | Status: DC | PRN

## 2023-09-09 MED ORDER — HYDROMORPHONE HCL 1 MG/ML IJ SOLN
0.5000 mg | INTRAMUSCULAR | Status: DC | PRN
  Administered 2023-09-09: 0.5 mg via INTRAVENOUS
  Filled 2023-09-09: qty 0.5

## 2023-09-09 MED ORDER — METHOCARBAMOL 500 MG PO TABS
500.0000 mg | ORAL_TABLET | Freq: Three times a day (TID) | ORAL | Status: DC | PRN
  Administered 2023-09-11 – 2023-09-12 (×2): 500 mg via ORAL
  Filled 2023-09-09 (×2): qty 1

## 2023-09-09 MED ORDER — FENTANYL CITRATE (PF) 250 MCG/5ML IJ SOLN
INTRAMUSCULAR | Status: DC | PRN
  Administered 2023-09-09 (×3): 50 ug via INTRAVENOUS
  Administered 2023-09-09: 100 ug via INTRAVENOUS

## 2023-09-09 MED ORDER — CELECOXIB 200 MG PO CAPS
200.0000 mg | ORAL_CAPSULE | ORAL | Status: AC
  Filled 2023-09-09: qty 1

## 2023-09-09 MED ORDER — DIPHENHYDRAMINE HCL 25 MG PO CAPS: 25.0000 mg | ORAL_CAPSULE | Freq: Four times a day (QID) | ORAL | Status: DC | PRN

## 2023-09-09 MED ORDER — SUGAMMADEX SODIUM 200 MG/2ML IV SOLN
INTRAVENOUS | Status: DC | PRN
  Administered 2023-09-09: 200 mg via INTRAVENOUS

## 2023-09-09 MED ORDER — AMISULPRIDE (ANTIEMETIC) 5 MG/2ML IV SOLN
10.0000 mg | Freq: Once | INTRAVENOUS | Status: AC | PRN
  Administered 2023-09-09: 10 mg via INTRAVENOUS

## 2023-09-09 MED ORDER — ACETAMINOPHEN 500 MG PO TABS
1000.0000 mg | ORAL_TABLET | ORAL | Status: AC
  Filled 2023-09-09: qty 2

## 2023-09-09 MED ORDER — ENOXAPARIN SODIUM 40 MG/0.4ML IJ SOSY
40.0000 mg | PREFILLED_SYRINGE | INTRAMUSCULAR | Status: DC
  Administered 2023-09-10 – 2023-09-12 (×3): 40 mg via SUBCUTANEOUS
  Filled 2023-09-09 (×3): qty 0.4

## 2023-09-09 MED ORDER — SUCCINYLCHOLINE CHLORIDE 200 MG/10ML IV SOSY
PREFILLED_SYRINGE | INTRAVENOUS | Status: DC | PRN
  Administered 2023-09-09: 80 mg via INTRAVENOUS

## 2023-09-09 MED ORDER — METOPROLOL TARTRATE 5 MG/5ML IV SOLN: 5.0000 mg | Freq: Four times a day (QID) | INTRAVENOUS | Status: DC | PRN

## 2023-09-09 MED ORDER — ONDANSETRON HCL 4 MG/2ML IJ SOLN
INTRAMUSCULAR | Status: DC | PRN
  Administered 2023-09-09: 4 mg via INTRAVENOUS

## 2023-09-09 MED ORDER — DEXAMETHASONE SODIUM PHOSPHATE 10 MG/ML IJ SOLN
INTRAMUSCULAR | Status: AC
  Filled 2023-09-09: qty 1

## 2023-09-09 MED ORDER — OXYCODONE HCL 5 MG/5ML PO SOLN: 5.0000 mg | Freq: Once | ORAL | Status: DC | PRN

## 2023-09-09 MED ORDER — PROPOFOL 10 MG/ML IV BOLUS
INTRAVENOUS | Status: AC
  Filled 2023-09-09: qty 20

## 2023-09-09 SURGICAL SUPPLY — 46 items
APPLIER CLIP ROT 10 11.4 M/L (STAPLE) ×1 IMPLANT
BAG COUNTER SPONGE SURGICOUNT (BAG) ×1 IMPLANT
CANISTER SUCT 3000ML PPV (MISCELLANEOUS) ×1 IMPLANT
CATH URETL OPEN END 6FR 70 (CATHETERS) IMPLANT
CHLORAPREP W/TINT 26 (MISCELLANEOUS) ×1 IMPLANT
CLIP APPLIE ROT 10 11.4 M/L (STAPLE) ×1 IMPLANT
COVER MAYO STAND STRL (DRAPES) ×1 IMPLANT
COVER SURGICAL LIGHT HANDLE (MISCELLANEOUS) ×1 IMPLANT
DERMABOND ADVANCED .7 DNX12 (GAUZE/BANDAGES/DRESSINGS) ×1 IMPLANT
DRAPE C-ARM 42X120 X-RAY (DRAPES) ×1 IMPLANT
ELECT REM PT RETURN 9FT ADLT (ELECTROSURGICAL) ×1 IMPLANT
ELECTRODE REM PT RTRN 9FT ADLT (ELECTROSURGICAL) ×1 IMPLANT
ENDOLOOP SUT PDS II 0 18 (SUTURE) IMPLANT
GLOVE BIO SURGEON STRL SZ7 (GLOVE) ×1 IMPLANT
GOWN STRL REUS W/ TWL LRG LVL3 (GOWN DISPOSABLE) ×2 IMPLANT
GOWN STRL REUS W/ TWL XL LVL3 (GOWN DISPOSABLE) ×1 IMPLANT
GRASPER SUT TROCAR 14GX15 (MISCELLANEOUS) ×1 IMPLANT
IRRIG SUCT STRYKERFLOW 2 WTIP (MISCELLANEOUS) ×1 IMPLANT
IRRIGATION SUCT STRKRFLW 2 WTP (MISCELLANEOUS) ×1 IMPLANT
KIT BASIN OR (CUSTOM PROCEDURE TRAY) ×1 IMPLANT
KIT IMAGING PINPOINTPAQ (MISCELLANEOUS) IMPLANT
KIT TURNOVER KIT B (KITS) ×1 IMPLANT
L-HOOK LAP DISP 36CM (ELECTROSURGICAL) ×1 IMPLANT
LHOOK LAP DISP 36CM (ELECTROSURGICAL) ×1 IMPLANT
NDL 22X1.5 STRL (OR ONLY) (MISCELLANEOUS) ×1 IMPLANT
NDL INSUFFLATION 14GA 120MM (NEEDLE) ×1 IMPLANT
NEEDLE 22X1.5 STRL (OR ONLY) (MISCELLANEOUS) ×1 IMPLANT
NEEDLE INSUFFLATION 14GA 120MM (NEEDLE) ×1 IMPLANT
NS IRRIG 1000ML POUR BTL (IV SOLUTION) ×1 IMPLANT
PAD ARMBOARD POSITIONER FOAM (MISCELLANEOUS) ×1 IMPLANT
PENCIL BUTTON HOLSTER BLD 10FT (ELECTRODE) ×1 IMPLANT
POUCH RETRIEVAL ECOSAC 10 (ENDOMECHANICALS) ×1 IMPLANT
SCISSORS LAP 5X35 DISP (ENDOMECHANICALS) ×1 IMPLANT
SET CHOLANGIOGRAPH 5 50 .035 (SET/KITS/TRAYS/PACK) ×1 IMPLANT
SET TUBE SMOKE EVAC HIGH FLOW (TUBING) ×1 IMPLANT
SLEEVE Z-THREAD 5X100MM (TROCAR) ×2 IMPLANT
SPECIMEN JAR SMALL (MISCELLANEOUS) ×1 IMPLANT
STOPCOCK 4 WAY LG BORE MALE ST (IV SETS) IMPLANT
SUT MNCRL AB 4-0 PS2 18 (SUTURE) ×1 IMPLANT
TOWEL GREEN STERILE (TOWEL DISPOSABLE) ×1 IMPLANT
TOWEL GREEN STERILE FF (TOWEL DISPOSABLE) ×1 IMPLANT
TRAY LAPAROSCOPIC MC (CUSTOM PROCEDURE TRAY) ×1 IMPLANT
TROCAR Z THREAD OPTICAL 12X100 (TROCAR) ×1 IMPLANT
TROCAR Z-THREAD OPTICAL 5X100M (TROCAR) ×1 IMPLANT
WARMER LAPAROSCOPE (MISCELLANEOUS) ×1 IMPLANT
WATER STERILE IRR 1000ML POUR (IV SOLUTION) ×1 IMPLANT

## 2023-09-09 NOTE — Anesthesia Postprocedure Evaluation (Signed)
 Anesthesia Post Note  Patient: Erin Henry  Procedure(s) Performed: LAPAROSCOPIC CHOLECYSTECTOMY WITH INTRAOPERATIVE CHOLANGIOGRAM     Patient location during evaluation: PACU Anesthesia Type: General Level of consciousness: awake and alert Pain management: pain level controlled Vital Signs Assessment: post-procedure vital signs reviewed and stable Respiratory status: spontaneous breathing, nonlabored ventilation and respiratory function stable Cardiovascular status: blood pressure returned to baseline Postop Assessment: no apparent nausea or vomiting Anesthetic complications: no   There were no known notable events for this encounter.  Last Vitals:  Vitals:   09/09/23 1715 09/09/23 1730  BP: (!) 141/82 138/81  Pulse: 87 88  Resp: 18 14  Temp:    SpO2: 94% 95%    Last Pain:  Vitals:   09/09/23 1710  TempSrc:   PainSc: 8                  Shanda Howells

## 2023-09-09 NOTE — Discharge Instructions (Addendum)
CIRUGIA LAPAROSCOPICA: INSTRUCCIONES DE POST OPERATORIO.  Revise siempre los documentos que le entreguen en el lugar donde se ha hecho la cirugia.  SI USTED NECESITA DOCUMENTOS DE INCAPACIDAD (DISABLE) O DE PERMISO FAMILAR (FAMILY LEAVE) NECESITA TRAERLOS A LA OFICINA PARA QUE SEAN PROCESADOS. NO  SE LOS DE A SU DOCTOR. A su alta del hospital se le dara una receta para controlar el dolor. Tomela como ha sido recetada, si la necesita. Si no la necesita puede tomar, Acetaminofen (Tylenol) o Ibuprofen (Advil) para aliviar dolor moderado. Continue tomando el resto de sus medicinas. Si necesita rellenar la receta, llame a la farmacia. ellos contactan a nuestra oficina pidiendo autorizacion. Este tipo de receta no pueden ser rellenadas despues de las  5pm o durante los fines de semana. Con relacion a la dieta: debe ser ligera los primeros dias despues que llege a la casa. Ejemplo: sopas y galleticas. Tome bastante liquido esos dias. La mayoria de los pacientes padecen de inflamacion y cambio de coloracion de la piel alrededor de las incisiones. esto toma dias en resolver.  pnerse una bolsa de hielo en el area affectada ayuda..  Es comun tambien tener un poco de estrenimiento si esta tomado medicinas para el dolor. incremente la cantidad de liquidos a tomar y puede tomar (Colace) esto previene el problema. Si ya tiene estrenimiento, es decir no ha defecado en 48 horas, puede tomar un laxativo (Milk of Magnesia or Miralax) uselo como el paquete le explica.  A menos que se le diga algo diferente. Remueva el bendaje a las 24-48 horas despues dela cirugia. y puede banarse en la ducha sin ningun problema. usted puede tener steri-strips (pequenas curitas transparentes en la piel puesta encima de la incision)  Estas banditas strips should be left on the skin for 7-10 days.   Si su cirujano puso pegamento encima de la incision usted puede banarse bajo la ducha en 24 horas. Este pegamento empezara a caerse en las  proximas 2-3 semanas. Si le pusieron suturas o presillas (grapos) estos seran quitados en su proxima cita en la oficina. . ACTIVIDADES:  Puede hacer actividad ligera.  Como caminar , subir escaleras y poco a poco irlas incrementando tanto como las tolere. Puede tener relaciones sexuales cuando sea comfortable. No carge objetos pesados o haga esfuerzos que no sean aprovados por su doctor. Puede manejar en cuanto no esta tomando medicamentos fuertes (narcoticos) para el dolor, pueda abrochar confortablemente el cinturon de seguridad, y pueda maniobrar y usar los pedales de su vehiculo con seguridad. PUEDE REGRESAR A TRABAJAR  Debe ver a su doctor para una cita de seguimiento en 2-3 semanas despues de la cirugia.  OTRAS ISNSTRUCCIONES:___________________________________________________________________________________ CUANDO LLAMAR A SU MEDICO: FIEBRE mayor de  101.0 No produccion de orina. Sangramiento continue de la herida Incremento de dolor, enrojecimientio o drenaje de la herida (incision) Incremento de dolor abdominal.  The clinic staff is available to answer your questions during regular business hours.  Please don't hesitate to call and ask to speak to one of the nurses for clinical concerns.  If you have a medical emergency, go to the nearest emergency room or call 911.  A surgeon from [facility] is always on call at the hospital. [address], [address], [place], [place]  [address] ? [address] [address], [place], [place]   [address] [phone] ? [phone] ? FAX [phone] Web site: [email or web address]    Managing Your Pain After Surgery Without Opioids    Thank you for participating in   our program to help patients manage their pain after surgery without opioids. This is part of our effort to provide you with the best care possible, without exposing you or your family to the risk that opioids pose.  What pain can I expect after surgery? You can expect  to have some pain after surgery. This is normal. The pain is typically worse the day after surgery, and quickly begins to get better. Many studies have found that many patients are able to manage their pain after surgery with Over-the-Counter (OTC) medications such as Tylenol and Motrin. If you have a condition that does not allow you to take Tylenol or Motrin, notify your surgical team.  How will I manage my pain? The best strategy for controlling your pain after surgery is around the clock pain control with Tylenol (acetaminophen) and Motrin (ibuprofen or Advil). Alternating these medications with each other allows you to maximize your pain control. In addition to Tylenol and Motrin, you can use heating pads or ice packs on your incisions to help reduce your pain.  How will I alternate your regular strength over-the-counter pain medication? You will take a dose of pain medication every three hours. Start by taking 650 mg of Tylenol (2 pills of 325 mg) 3 hours later take 600 mg of Motrin (3 pills of 200 mg) 3 hours after taking the Motrin take 650 mg of Tylenol 3 hours after that take 600 mg of Motrin.   - 1 -  See example - if your first dose of Tylenol is at 12:00 PM   12:00 PM Tylenol 650 mg (2 pills of 325 mg)  3:00 PM Motrin 600 mg (3 pills of 200 mg)  6:00 PM Tylenol 650 mg (2 pills of 325 mg)  9:00 PM Motrin 600 mg (3 pills of 200 mg)  Continue alternating every 3 hours   We recommend that you follow this schedule around-the-clock for at least 3 days after surgery, or until you feel that it is no longer needed. Use the table on the last page of this handout to keep track of the medications you are taking. Important: Do not take more than 3000mg of Tylenol or 3200mg of Motrin in a 24-hour period. Do not take ibuprofen/Motrin if you have a history of bleeding stomach ulcers, severe kidney disease, &/or actively taking a blood thinner  What if I still have pain? If you have pain  that is not controlled with the over-the-counter pain medications (Tylenol and Motrin or Advil) you might have what we call "breakthrough" pain. You will receive a prescription for a small amount of an opioid pain medication such as Oxycodone, Tramadol, or Tylenol with Codeine. Use these opioid pills in the first 24 hours after surgery if you have breakthrough pain. Do not take more than 1 pill every 4-6 hours.  If you still have uncontrolled pain after using all opioid pills, don't hesitate to call our staff using the number provided. We will help make sure you are managing your pain in the best way possible, and if necessary, we can provide a prescription for additional pain medication.   Day 1    Time  Name of Medication Number of pills taken  Amount of Acetaminophen  Pain Level   Comments  AM PM       AM PM       AM PM       AM PM       AM PM         AM PM       AM PM       AM PM       Total Daily amount of Acetaminophen Do not take more than  3,000 mg per day      Day 2    Time  Name of Medication Number of pills taken  Amount of Acetaminophen  Pain Level   Comments  AM PM       AM PM       AM PM       AM PM       AM PM       AM PM       AM PM       AM PM       Total Daily amount of Acetaminophen Do not take more than  3,000 mg per day      Day 3    Time  Name of Medication Number of pills taken  Amount of Acetaminophen  Pain Level   Comments  AM PM       AM PM       AM PM       AM PM          AM PM       AM PM       AM PM       AM PM       Total Daily amount of Acetaminophen Do not take more than  3,000 mg per day      Day 4    Time  Name of Medication Number of pills taken  Amount of Acetaminophen  Pain Level   Comments  AM PM       AM PM       AM PM       AM PM       AM PM       AM PM       AM PM       AM PM       Total Daily amount of Acetaminophen Do not take more than  3,000 mg per day      Day 5    Time  Name of  Medication Number of pills taken  Amount of Acetaminophen  Pain Level   Comments  AM PM       AM PM       AM PM       AM PM       AM PM       AM PM       AM PM       AM PM       Total Daily amount of Acetaminophen Do not take more than  3,000 mg per day       Day 6    Time  Name of Medication Number of pills taken  Amount of Acetaminophen  Pain Level  Comments  AM PM       AM PM       AM PM       AM PM       AM PM       AM PM       AM PM       AM PM       Total Daily amount of Acetaminophen Do not take more than  3,000 mg per day      Day 7    Time    Name of Medication Number of pills taken  Amount of Acetaminophen  Pain Level   Comments  AM PM       AM PM       AM PM       AM PM       AM PM       AM PM       AM PM       AM PM       Total Daily amount of Acetaminophen Do not take more than  3,000 mg per day        For additional information about how and where to safely dispose of unused opioid medications - https://www.morepowerfulnc.org  Disclaimer: This document contains information and/or instructional materials adapted from [facility] for the typical patient with your condition. It does not replace medical advice from your health care provider because your experience may differ from that of the typical patient. Talk to your health care provider if you have any questions about this document, your condition or your treatment plan. Adapted from [facility]  

## 2023-09-09 NOTE — Anesthesia Preprocedure Evaluation (Addendum)
 Anesthesia Evaluation  Patient identified by MRN, date of birth, ID band Patient awake    Reviewed: Allergy & Precautions, NPO status , Patient's Chart, lab work & pertinent test results  Airway Mallampati: II       Dental no notable dental hx.    Pulmonary neg pulmonary ROS   Pulmonary exam normal        Cardiovascular negative cardio ROS Normal cardiovascular exam     Neuro/Psych negative neurological ROS  negative psych ROS   GI/Hepatic negative GI ROS, Neg liver ROS,,,  Endo/Other  negative endocrine ROS    Renal/GU negative Renal ROS     Musculoskeletal negative musculoskeletal ROS (+)    Abdominal  (+) + obese  Peds  Hematology negative hematology ROS (+)   Anesthesia Other Findings cholelithiasis  Reproductive/Obstetrics Hcg negative                             Anesthesia Physical Anesthesia Plan  ASA: 2  Anesthesia Plan: General   Post-op Pain Management:    Induction: Intravenous  PONV Risk Score and Plan: 4 or greater and Ondansetron, Dexamethasone, Midazolam, Treatment may vary due to age or medical condition and Scopolamine patch - Pre-op  Airway Management Planned: Oral ETT  Additional Equipment:   Intra-op Plan:   Post-operative Plan: Extubation in OR  Informed Consent: I have reviewed the patients History and Physical, chart, labs and discussed the procedure including the risks, benefits and alternatives for the proposed anesthesia with the patient or authorized representative who has indicated his/her understanding and acceptance.     Dental advisory given and Interpreter used for interview  Plan Discussed with: CRNA  Anesthesia Plan Comments:        Anesthesia Quick Evaluation

## 2023-09-09 NOTE — ED Triage Notes (Signed)
 Pt./son translator stated, she had had a stomach ache with N/V  since yesterday.

## 2023-09-09 NOTE — H&P (Signed)
 History and Physical Exam Note  [facility] June 02, 1986  [id number].    Requesting MD: Dr. Rhunette Croft Chief Complaint/Reason for Consult: abdominal pain, cholecystitis  HPI:  38 y.o. female with no significant PMH who presented to [facility] ED with abdominal pain which began last night around 10 pm. She had recently eaten dinner at 8 pm - rice soup. Pain located in RUQ with radiation to her back and she has had associated n/v. She has no prior episodes of similar symptoms. She tried drinking some hot tea to see if this would alleviate her pain, but to no avail.  Because of persistent pain, she presented to the ED where her labs were unremarkable, except mild leukocytosis of 12.8 and an [place] that reveals multiple gallstones with one measuring 1.7cm and wall thickening of 3mm.  Her LFTs are normal but her CBD is 10-5mm with some intrahepatic ductal dilatation.  We have been asked to see.  At time of my exam she is starting to have worsening pain and asking for pain medications. She is accompanied by her son.   Substance use: denies Allergies: NKDA Blood thinners: none Past Surgeries: breast cyst excision  She works as a Child psychotherapist.  ROS: Reviewed and as above  Family History  Problem Relation Age of Onset   Diabetes Mother     Past Medical History:  Diagnosis Date   Medical history non-contributory     Past Surgical History:  Procedure Laterality Date   BREAST CYST EXCISION      Social History:  reports that she has never smoked. She has never used smokeless tobacco. She reports that she does not drink alcohol and does not use drugs.  Allergies: No Known Allergies  (Not in a hospital admission)   Blood pressure (!) 150/92, pulse (!) 59, temperature 98.4 F (36.9 C), resp. rate 16, last menstrual period 08/28/2023, SpO2 99%, currently breastfeeding. Physical Exam: General: pleasant, WD, female who is laying in bed in NAD, uncomfortable appearing HEENT: head  is normocephalic, atraumatic.  Sclera are noninjected.  Pupils equal and round. EOMs intact.  Ears and nose without any masses or lesions.  Mouth is pink and moist Lungs: respiratory effort nonlabored on room air Abd: soft, ND, moderate TTP in RUQ and epigastrium with RUQ>epigastrium. No rebound or guarding MSK: all 4 extremities are symmetrical with no cyanosis, clubbing, or edema. Skin: warm and dry with no masses, lesions, or rashes Neuro: Cranial nerves 2-12 grossly intact, sensation is normal throughout Psych: A&Ox3 with an appropriate affect.    Results for orders placed or performed during the hospital encounter of 09/09/23 (from the past 48 hours)  hCG, serum, qualitative     Status: None   Collection Time: 09/09/23 10:36 AM  Result Value Ref Range   Preg, Serum NEGATIVE NEGATIVE    Comment: SLIGHT HEMOLYSIS        THE SENSITIVITY OF THIS METHODOLOGY IS >10 mIU/mL. Performed at [facility], [address], [place], [place] [address]   Comprehensive metabolic panel with GFR     Status: Abnormal   Collection Time: 09/09/23 11:40 AM  Result Value Ref Range   Sodium 135 135 - 145 mmol/L   Potassium 3.6 3.5 - 5.1 mmol/L   Chloride 103 98 - 111 mmol/L   CO2 22 22 - 32 mmol/L   Glucose, Bld 128 (H) 70 - 99 mg/dL    Comment: Glucose reference range applies only to samples taken after fasting for  at least 8 hours.   BUN 20 6 - 20 mg/dL   Creatinine, Ser 1.61 0.44 - 1.00 mg/dL   Calcium 8.8 (L) 8.9 - 10.3 mg/dL   Total Protein 7.2 6.5 - 8.1 g/dL   Albumin 3.9 3.5 - 5.0 g/dL   AST 22 15 - 41 U/L   ALT 27 0 - 44 U/L   Alkaline Phosphatase 98 38 - 126 U/L   Total Bilirubin 0.7 0.0 - 1.2 mg/dL   GFR, Estimated >09 >60 mL/min    Comment: (NOTE) Calculated using the CKD-EPI Creatinine Equation (2021)    Anion gap 10 5 - 15    Comment: Performed at [facility], [address], [place], [place] [address]  Lipase, blood     Status: None   Collection Time: 09/09/23  11:40 AM  Result Value Ref Range   Lipase 29 11 - 51 U/L    Comment: Performed at [facility], [address] [place], [place] [address]   US Abdomen Limited RUQ (LIVER/GB) Result Date: 09/09/2023 CLINICAL DATA:  Right upper quadrant abdominal pain. EXAM: ULTRASOUND ABDOMEN LIMITED RIGHT UPPER QUADRANT COMPARISON:  None Available. FINDINGS: Gallbladder: Multiple shadowing echogenic foci, compatible with gallstones, with the largest measuring up to 1.7 cm. Distended gallbladder with wall thickness measuring up to 2-3 mm. Sonographic Murphy sign was positive as per the sonographer. Common bile duct: Diameter: 10-11 mm. Intrahepatic biliary dilatation is noted. Evaluation is somewhat limited secondary to shadowing from gallstones. Liver: No focal lesion identified. Liver demonstrates increased parenchymal echogenicity. Portal vein is patent on color Doppler imaging with normal direction of blood flow towards the liver. Other: None. IMPRESSION: 1. Distended gallbladder with multiple gallstones. No significant gallbladder wall thickening or pericholecystic fluid identified. Sonographic Murphy sign was positive as per the sonographer. Findings are equivocal for acute cholecystitis on this exam. Recommend clinical correlation to determine the need for further imaging with nuclear medicine HIDA scan. 2. Dilation of the intrahepatic biliary tree and common bile duct. Evaluation is somewhat limited secondary to shadowing from gallstones. Further evaluation with MRCP could be considered. 3. Increased hepatic parenchymal echogenicity, which is nonspecific but can be seen in the setting of hepatic steatosis or other hepatocellular disease processes. Recommend correlation with liver function tests. Electronically Signed   By: Hart Robinsons M.D.   On: 09/09/2023 12:10      Assessment/Plan Calculous cholecystitis with dilated CBD The patient has been seen, examined, labs, vitals, chart, and imaging reviewed.   She appears to likely have early signs of cholecystitis.  She has been given Rocephin in the ED already.  Her LFTs are normal, but her CBD is dilated with some intra-hepatic ductal dilatation.  Because of this, we will plan to proceed with an IOC during OR for lap chole.  I have explained the procedure, risks, and aftercare of cholecystectomy.  Risks include but are not limited to bleeding, infection, wound problems, anesthesia, diarrhea, bile leak, injury to common bile duct/liver/intestine, possibility of IOC positive for biliary obstruction and possible need for further procedures.  She seems to understand and agrees to proceed.  If all goes well, we will tentatively plan for DC home from PACU.   FEN: NPO ID: [email or web address] VTE: SCDs in OR  Due to language barrier, an interpreter was present during the history-taking and subsequent discussion (and for part of the physical exam) with this patient.   I reviewed ED provider notes, last 24 h vitals and pain scores, last  48 h intake and output, last 24 h labs and trends, and last 24 h imaging results.   Eric Form, PA-C [facility] 09/09/2023, 1:10 PM Please see Amion for pager number during day hours 7:00am-4:30pm

## 2023-09-09 NOTE — ED Provider Notes (Signed)
 [place] EMERGENCY DEPARTMENT AT [facility] Provider Note   CSN: [id number] Arrival date & time: 09/09/23  4010     History  Chief Complaint  Patient presents with   Abdominal Pain   Emesis   Nausea    Erin Henry is a 38 y.o. female.  Patient with no past surgical history of the abdomen presents to the emergency department for acute onset of right upper quadrant abdominal pain with radiation to the back with associated nausea and vomiting, starting 10 PM last night.  She tried drinking a tea which did not help her symptoms.  No chest pain or shortness of breath.  No urinary symptoms.  No diarrhea.  No family history of gallbladder disease.  Patient has not had any similar pains in the past, even briefly or less severe.       Home Medications Prior to Admission medications   Medication Sig Start Date End Date Taking? Authorizing Provider  Prenatal Vit-Fe Fumarate-FA (MULTIVITAMIN-PRENATAL) 27-0.8 MG TABS tablet Take 1 tablet by mouth daily at 12 noon.     [provider]      Allergies    Patient has no known allergies.    Review of Systems   Review of Systems  Physical Exam Updated Vital Signs BP (!) 157/94 (BP Location: Right Arm)   Pulse (!) 58   Temp 98.7 F (37.1 C)   Resp 16   LMP 08/28/2023   SpO2 100%  Physical Exam Vitals and nursing note reviewed.  Constitutional:      General: She is in acute distress (Appears uncomfortable).     Appearance: She is well-developed.  HENT:     Head: Normocephalic and atraumatic.     Right Ear: External ear normal.     Left Ear: External ear normal.     Nose: Nose normal.  Eyes:     Conjunctiva/sclera: Conjunctivae normal.  Cardiovascular:     Rate and Rhythm: Normal rate and regular rhythm.     Heart sounds: No murmur heard. Pulmonary:     Effort: No respiratory distress.     Breath sounds: No wheezing, rhonchi or rales.  Abdominal:     Palpations: Abdomen is soft.      Tenderness: There is abdominal tenderness in the right upper quadrant. There is no guarding or rebound. Positive signs include Murphy's sign. Negative signs include McBurney's sign.  Musculoskeletal:     Cervical back: Normal range of motion and neck supple.     Right lower leg: No edema.     Left lower leg: No edema.  Skin:    General: Skin is warm and dry.     Findings: No rash.  Neurological:     General: No focal deficit present.     Mental Status: She is alert. Mental status is at baseline.     Motor: No weakness.  Psychiatric:        Mood and Affect: Mood normal.     ED Results / Procedures / Treatments   Labs (all labs ordered are listed, but only abnormal results are displayed) Labs Reviewed  COMPREHENSIVE METABOLIC PANEL WITH GFR - Abnormal; Notable for the following components:      Result Value   Glucose, Bld 128 (*)    Calcium 8.8 (*)    All other components within normal limits  CBC WITH DIFFERENTIAL/PLATELET - Abnormal; Notable for the following components:   WBC 12.8 (*)    Neutro Abs 9.6 (*)  All other components within normal limits  HCG, SERUM, QUALITATIVE  LIPASE, BLOOD  URINALYSIS, ROUTINE W REFLEX MICROSCOPIC    EKG None  Radiology US Abdomen Limited RUQ (LIVER/GB) Result Date: 09/09/2023 CLINICAL DATA:  Right upper quadrant abdominal pain. EXAM: ULTRASOUND ABDOMEN LIMITED RIGHT UPPER QUADRANT COMPARISON:  None Available. FINDINGS: Gallbladder: Multiple shadowing echogenic foci, compatible with gallstones, with the largest measuring up to 1.7 cm. Distended gallbladder with wall thickness measuring up to 2-3 mm. Sonographic Murphy sign was positive as per the sonographer. Common bile duct: Diameter: 10-11 mm. Intrahepatic biliary dilatation is noted. Evaluation is somewhat limited secondary to shadowing from gallstones. Liver: No focal lesion identified. Liver demonstrates increased parenchymal echogenicity. Portal vein is patent on color Doppler imaging  with normal direction of blood flow towards the liver. Other: None. IMPRESSION: 1. Distended gallbladder with multiple gallstones. No significant gallbladder wall thickening or pericholecystic fluid identified. Sonographic Murphy sign was positive as per the sonographer. Findings are equivocal for acute cholecystitis on this exam. Recommend clinical correlation to determine the need for further imaging with nuclear medicine HIDA scan. 2. Dilation of the intrahepatic biliary tree and common bile duct. Evaluation is somewhat limited secondary to shadowing from gallstones. Further evaluation with MRCP could be considered. 3. Increased hepatic parenchymal echogenicity, which is nonspecific but can be seen in the setting of hepatic steatosis or other hepatocellular disease processes. Recommend correlation with liver function tests. Electronically Signed   By: Hart Robinsons M.D.   On: 09/09/2023 12:10    Procedures Procedures    Medications Ordered in ED Medications  cefTRIAXone (ROCEPHIN) 2 g in sodium chloride 0.9 % 100 mL IVPB (has no administration in time range)  acetaminophen (TYLENOL) tablet 1,000 mg (has no administration in time range)  celecoxib (CELEBREX) capsule 200 mg (has no administration in time range)  celecoxib (CELEBREX) 200 MG capsule (has no administration in time range)  chlorhexidine (PERIDEX) 0.12 % solution (has no administration in time range)  HYDROmorphone (DILAUDID) injection 0.5 mg (0.5 mg Intravenous Given 09/09/23 1008)  ondansetron (ZOFRAN) injection 4 mg (4 mg Intravenous Given 09/09/23 1007)  HYDROmorphone (DILAUDID) injection 0.5 mg (0.5 mg Intravenous Given 09/09/23 1351)    ED Course/ Medical Decision Making/ A&P    Patient seen and examined. History obtained directly from patient.   Labs/EKG: Ordered CBC, CMP, lipase, UA, pregnancy.  Imaging: Ordered right upper quadrant ultrasound.  Medications/Fluids: Ordered: Dilaudid/Zofran.   Most recent vital signs  reviewed and are as follows: BP (!) 157/94 (BP Location: Right Arm)   Pulse (!) 58   Temp 98.7 F (37.1 C)   Resp 16   LMP 08/28/2023   SpO2 100%   Initial impression: Right upper quadrant pain, high clinical concern for acute cholecystitis or symptomatic cholelithiasis  12:23 PM Reassessment performed. Patient appears comfortable currently.  Pain continues to be present but she is not requesting any more pain medication.  Labs personally reviewed and interpreted including: Pregnancy negative, CBC, CMP and lipase needed to be redrawn and these are in process.  Imaging personally visualized and interpreted including: Ultrasound agree gallstones and biliary dilation, suspect acute cholecystitis  Reviewed pertinent lab work and imaging with patient at bedside. Questions answered.   Most current vital signs reviewed and are as follows: BP (!) 157/94 (BP Location: Right Arm)   Pulse (!) 58   Temp 98.7 F (37.1 C)   Resp 16   LMP 08/28/2023   SpO2 100%   Plan: Patient updated, will need surgical  consult.  Awaiting labs to determine if she could have a retained biliary stone.  12:59 PM I have ordered Rocephin.  Continuing to await CBC.  CMP and lipase with normal transaminases, bilirubin, lipase --argues against acute biliary obstruction.  1:30 PM I discussed case with general surgery APP Johnny Bridge, will see for consult.   1:42 PM CBC with mild elevation in WBC. Additional pain medication ordered.   2:05 PM Plan surgery admit.                                  Medical Decision Making Amount and/or Complexity of Data Reviewed Labs: ordered. Radiology: ordered.  Risk Prescription drug management. Decision regarding hospitalization.   For this patient's complaint of abdominal pain, the following conditions were considered on the differential diagnosis: gastritis/PUD, enteritis/duodenitis, appendicitis, cholelithiasis/cholecystitis, cholangitis, pancreatitis, ruptured viscus,  colitis, diverticulitis, small/large bowel obstruction, proctitis, cystitis, pyelonephritis, ureteral colic, aortic dissection, aortic aneurysm. In women, ectopic pregnancy, pelvic inflammatory disease, ovarian cysts, and tubo-ovarian abscess were also considered. Atypical chest etiologies were also considered including ACS, PE, and pneumonia.  Will need admission for suspected acute cholecystitis/symptomatic cholelithiasis.        Final Clinical Impression(s) / ED Diagnoses Final diagnoses:  Acute cholecystitis due to biliary calculus    Rx / DC Orders ED Discharge Orders     None         Renne Crigler, PA-C 09/09/23 1405    Derwood Kaplan, MD 09/13/23 1537

## 2023-09-09 NOTE — Transfer of Care (Signed)
 Immediate Anesthesia Transfer of Care Note  Patient: Erin Henry  Procedure(s) Performed: LAPAROSCOPIC CHOLECYSTECTOMY WITH INTRAOPERATIVE CHOLANGIOGRAM  Patient Location: PACU  Anesthesia Type:General  Level of Consciousness: drowsy  Airway & Oxygen Therapy: Patient Spontanous Breathing  Post-op Assessment: Report given to RN and Post -op Vital signs reviewed and stable  Post vital signs: Reviewed and stable  Last Vitals:  Vitals Value Taken Time  BP 130/83 09/09/23 1641  Temp 36.9 C 09/09/23 1641  Pulse 84 09/09/23 1642  Resp 20 09/09/23 1641  SpO2 95 % 09/09/23 1642    Last Pain:  Vitals:   09/09/23 1435  TempSrc:   PainSc: 5          Complications: There were no known notable events for this encounter.

## 2023-09-09 NOTE — Anesthesia Procedure Notes (Signed)
 Procedure Name: Intubation Date/Time: 09/09/2023 3:02 PM  Performed by: Sharyn Dross, CRNAPre-anesthesia Checklist: Patient identified, Emergency Drugs available, Suction available and Patient being monitored Patient Re-evaluated:Patient Re-evaluated prior to induction Oxygen Delivery Method: Circle system utilized Preoxygenation: Pre-oxygenation with 100% oxygen Induction Type: IV induction Ventilation: Mask ventilation without difficulty Laryngoscope Size: Mac and 4 Grade View: Grade I Tube type: Oral Tube size: 7.0 mm Number of attempts: 1 Airway Equipment and Method: Stylet and Oral airway Placement Confirmation: ETT inserted through vocal cords under direct vision, positive ETCO2 and breath sounds checked- equal and bilateral Secured at: 22 cm Tube secured with: Tape Dental Injury: Teeth and Oropharynx as per pre-operative assessment

## 2023-09-09 NOTE — Op Note (Signed)
 Patient: Erin Henry MRN: [id number] DOB: 05/11/1986 Sex: female  Operation/Procedure Date: 09/09/2023  Surgeons and Role:    * Hillery Hunter Lucilla Edin, MD - Primary      Pre-operative Diagnoses: Acute cholecystitis  Postoperative Diagnoses: Acute cholecystitis   Procedure performed: Laparoscopic cholecystectomy with intra-operative cholangiogram  Anesthesia: General endotracheal anesthesia  Indications: Erin Henry is 38 year old female who presented to the hospital with abdominal pain and nausea. Imaging, history, and exam were consistent with acute cholecystitis. There was concern for CBD dilation on the ultrasound so IOC was recommended. Preoperatively, I discussed in detail the risks, benefits, alternatives, and potential complications. The patient understands and requests to proceed.  Operative Findings: Inflammation consistent with acute cholecystitis. IOC showed a dilated CBD but there was filling of the duodenum and no clear obstruction.   Operative Narrative: The patient was positively identified and was taken to the operating room and placed supine on the operating table. A time-out was performed confirming correct patient and procedure. We also confirmed initiation of deep venous thrombosis prophylaxis and wound prophylaxis. After successful induction of general endotracheal anesthesia, the arms were carefully padded. An orogastric tube and footboard were placed. The abdomen was prepped and draped in the usual sterile surgical fashion.  We began out access with a Veress needle in the LUQ at Palmer's point.  Following a aspiration of air and a positive saline drop test, the insufflation tubing was connected and the abdomen brought to a pressure of . A 5-mm port was placed right of the umbilicus using an opti-view technique. The abdomen was inspected and there were no signs of injury from access.  We placed three additional ports all under direct  vision, a 10-mm port in the subxiphoid position, one 5-mm port in the right midclavicular line, and one right subcostal port in the anterior axillary line. The patient was placed in the head up position and tilted slightly to the left. The dome of the gallbladder was grasped, elevated, and retracted anteriorly and cephalad. The infundibulum was retracted laterally and inferiorly exposing Calot's triangle. The investing visceral peritoneal attachments overlying the infundibulum of the gallbladder were dissected free from the gallbladder itself. We soon developed two structures into the gallbladder consistent with the cystic duct and cystic artery. The loose areolar tissue around these structures was dissected free. The gallbladder was separated from the gallbladder fossa for approximately a third the distance up from the cystic plate, establishing the critical view of safety. We then performed a cholangiogram using a cook catheter inserted into the cystic duct and secured with a clip.This revealed adequate length of cystic duct entering into what appeared to be a somewhat dilated common bile duct. The common hepatic duct right and left ducts were normal. There was appropriate sectoral branching seen bilaterally. There was no evidence of filling defect or stricture distally. There was prompt filling of the duodenum. The catheter was then removed and the cystic duct and cystic artery triply clipped. These were divided using laparoscopic scissors leaving a single clip on the removal side. The gallbladder was elevated off the gallbladder fossa using the hook Bovie. The gallbladder was then exteriorized through the subxiphoid position using a specimen bag. We reestablished pneumoperitoneum and confirmed no leakage of blood or bile. The subhepatic space was irrigated with warm sterile saline and suctioned free. The subxiphoid port was removed and the defect closed with an interrupted 0 vicryl on a suture passer.  The other  ports were removed under direct  vision and the abdomen was desufflated. We placed 0.25% Marcaine plain at each incision site for local anesthesia. The skin was closed using 4-0 Monocryl subcuticular suture. Dermabond was applied. The patient tolerated the procedure well, was extubated, and taken to the recovery room.  Estimated Blood Loss: Minimal Specimens: Gallbladder Implants: None Drains: None Complications: None Condition of the patient: Good, extubated Disposition: PACU   Moise Boring Date: 09/09/2023 Time: 4:24 PM

## 2023-09-10 ENCOUNTER — Encounter (HOSPITAL_COMMUNITY): Payer: Self-pay | Admitting: General Surgery

## 2023-09-10 DIAGNOSIS — K8040 Calculus of bile duct with cholecystitis, unspecified, without obstruction: Secondary | ICD-10-CM

## 2023-09-10 DIAGNOSIS — K8050 Calculus of bile duct without cholangitis or cholecystitis without obstruction: Secondary | ICD-10-CM

## 2023-09-10 DIAGNOSIS — K8000 Calculus of gallbladder with acute cholecystitis without obstruction: Secondary | ICD-10-CM | POA: Diagnosis present

## 2023-09-10 DIAGNOSIS — Z833 Family history of diabetes mellitus: Secondary | ICD-10-CM | POA: Diagnosis not present

## 2023-09-10 DIAGNOSIS — Z603 Acculturation difficulty: Secondary | ICD-10-CM | POA: Diagnosis present

## 2023-09-10 DIAGNOSIS — R1011 Right upper quadrant pain: Secondary | ICD-10-CM

## 2023-09-10 DIAGNOSIS — K8063 Calculus of gallbladder and bile duct with acute cholecystitis with obstruction: Secondary | ICD-10-CM | POA: Diagnosis present

## 2023-09-10 LAB — COMPREHENSIVE METABOLIC PANEL WITH GFR
ALT: 636 U/L — ABNORMAL HIGH (ref 0–44)
ALT: 441 U/L — ABNORMAL HIGH (ref 0–44)
AST: 417 U/L — ABNORMAL HIGH (ref 15–41)
AST: 205 U/L — ABNORMAL HIGH (ref 15–41)
Albumin: 3.6 g/dL (ref 3.5–5.0)
Albumin: 3.3 g/dL — ABNORMAL LOW (ref 3.5–5.0)
Alkaline Phosphatase: 139 U/L — ABNORMAL HIGH (ref 38–126)
Alkaline Phosphatase: 122 U/L (ref 38–126)
Anion gap: 11 (ref 5–15)
Anion gap: 8 (ref 5–15)
BUN: 11 mg/dL (ref 6–20)
BUN: 14 mg/dL (ref 6–20)
CO2: 24 mmol/L (ref 22–32)
CO2: 27 mmol/L (ref 22–32)
Calcium: 9.3 mg/dL (ref 8.9–10.3)
Calcium: 9 mg/dL (ref 8.9–10.3)
Chloride: 103 mmol/L (ref 98–111)
Chloride: 103 mmol/L (ref 98–111)
Creatinine, Ser: 0.6 mg/dL (ref 0.44–1.00)
Creatinine, Ser: 0.78 mg/dL (ref 0.44–1.00)
GFR, Estimated: >60 mL/min (ref >60)
GFR, Estimated: >60 mL/min (ref >60)
Glucose, Bld: 138 mg/dL — ABNORMAL HIGH (ref 70–99)
Glucose, Bld: 129 mg/dL — ABNORMAL HIGH (ref 70–99)
Potassium: 3.8 mmol/L (ref 3.5–5.1)
Potassium: 3.7 mmol/L (ref 3.5–5.1)
Sodium: 138 mmol/L (ref 135–145)
Sodium: 138 mmol/L (ref 135–145)
Total Bilirubin: 0.9 mg/dL (ref 0.0–1.2)
Total Bilirubin: 0.6 mg/dL (ref 0.0–1.2)
Total Protein: 7.1 g/dL (ref 6.5–8.1)
Total Protein: 6.6 g/dL (ref 6.5–8.1)

## 2023-09-10 LAB — CBC
HCT: 36.5 % (ref 36.0–46.0)
Hemoglobin: 12.8 g/dL (ref 12.0–15.0)
MCH: 29.8 pg (ref 26.0–34.0)
MCHC: 35.1 g/dL (ref 30.0–36.0)
MCV: 84.9 fL (ref 80.0–100.0)
Platelets: 283 10*3/uL (ref 150–400)
RBC: 4.3 MIL/uL (ref 3.87–5.11)
RDW: 12.4 % (ref 11.5–15.5)
WBC: 12 10*3/uL — ABNORMAL HIGH (ref 4.0–10.5)
nRBC: 0 % (ref 0.0–0.2)

## 2023-09-10 LAB — HIV ANTIBODY (ROUTINE TESTING W REFLEX): HIV Screen 4th Generation wRfx: NONREACTIVE

## 2023-09-10 NOTE — Progress Notes (Signed)
 1 Day Post-Op  Subjective: Exam completed with assistance of Spanish interpreting services.  She reports some incisional pain but overall her abdominal pain has improved significantly since surgery. She is tolerating some clear. Denies new complaints.   ROS: See above, otherwise other systems negative  Objective: Vital signs in last 24 hours: Temp:  [97.9 F (36.6 C)-98.7 F (37.1 C)] 98.3 F (36.8 C) (03/28 0722) Pulse Rate:  [62-98] 67 (03/28 0722) Resp:  [12-20] 16 (03/28 0722) BP: (107-153)/(71-92) 107/71 (03/28 0722) SpO2:  [54 %-100 %] 100 % (03/28 0722) Weight:  [87.5 kg] 87.5 kg (03/27 1429) Last BM Date : 09/08/23  Intake/Output from previous day: 03/27 0701 - 03/28 0700 In: 1440 [P.O.:240; I.V.:1200] Out: 25 [Blood:25] Intake/Output this shift: No intake/output data recorded.  PE: Gen: female, NAD Abd: soft, non-distended, port sites clean and dry with dermabond intact  Lab Results:  Recent Labs    09/09/23 1216 09/10/23 0440  WBC 12.8* 12.0*  HGB 12.9 12.8  HCT 38.0 36.5  PLT 267 283   BMET Recent Labs    09/09/23 1140 09/10/23 0440  NA 135 138  K 3.6 3.8  CL 103 103  CO2 22 24  GLUCOSE 128* 138*  BUN 20 11  CREATININE 0.58 0.60  CALCIUM 8.8* 9.3   PT/INR No results for input(s): "LABPROT", "INR" in the last 72 hours. CMP     Component Value Date/Time   NA 138 09/10/2023 0440   K 3.8 09/10/2023 0440   CL 103 09/10/2023 0440   CO2 24 09/10/2023 0440   GLUCOSE 138 (H) 09/10/2023 0440   GLUCOSE 82 03/30/2016 1208   BUN 11 09/10/2023 0440   CREATININE 0.60 09/10/2023 0440   CALCIUM 9.3 09/10/2023 0440   PROT 7.1 09/10/2023 0440   ALBUMIN 3.6 09/10/2023 0440   AST 417 (H) 09/10/2023 0440   ALT 636 (H) 09/10/2023 0440   ALKPHOS 139 (H) 09/10/2023 0440   BILITOT 0.9 09/10/2023 0440   GFRNONAA >60 09/10/2023 0440   GFRAA >60 05/23/2016 0504   Lipase     Component Value Date/Time   LIPASE 29 09/09/2023 1140     Studies/Results: DG Cholangiogram Operative Result Date: 09/10/2023 CLINICAL DATA:  [id number] Surgery, elective [id number] EXAM: INTRAOPERATIVE CHOLANGIOGRAM TECHNIQUE: Cholangiographic images from the C-arm fluoroscopic device were submitted for interpretation post-operatively. Please see the procedural report for the amount of contrast and the fluoroscopy time utilized. COMPARISON:  None Available. FINDINGS: Persistent filling defect in the distal CBD although some contrast does pass into the duodenum. Minimal leak at the level of the cystic duct. IMPRESSION: 1. Incompletely obstructive choledocholithiasis Electronically Signed   By: Corlis Leak M.D.   On: 09/10/2023 08:57   US Abdomen Limited RUQ (LIVER/GB) Result Date: 09/09/2023 CLINICAL DATA:  Right upper quadrant abdominal pain. EXAM: ULTRASOUND ABDOMEN LIMITED RIGHT UPPER QUADRANT COMPARISON:  None Available. FINDINGS: Gallbladder: Multiple shadowing echogenic foci, compatible with gallstones, with the largest measuring up to 1.7 cm. Distended gallbladder with wall thickness measuring up to 2-3 mm. Sonographic Murphy sign was positive as per the sonographer. Common bile duct: Diameter: 10-11 mm. Intrahepatic biliary dilatation is noted. Evaluation is somewhat limited secondary to shadowing from gallstones. Liver: No focal lesion identified. Liver demonstrates increased parenchymal echogenicity. Portal vein is patent on color Doppler imaging with normal direction of blood flow towards the liver. Other: None. IMPRESSION: 1. Distended gallbladder with multiple gallstones. No significant gallbladder wall thickening or pericholecystic fluid identified. Sonographic Murphy sign was positive  as per the sonographer. Findings are equivocal for acute cholecystitis on this exam. Recommend clinical correlation to determine the need for further imaging with nuclear medicine HIDA scan. 2. Dilation of the intrahepatic biliary tree and common bile duct. Evaluation is  somewhat limited secondary to shadowing from gallstones. Further evaluation with MRCP could be considered. 3. Increased hepatic parenchymal echogenicity, which is nonspecific but can be seen in the setting of hepatic steatosis or other hepatocellular disease processes. Recommend correlation with liver function tests. Electronically Signed   By: Hart Robinsons M.D.   On: 09/09/2023 12:10    Anti-infectives: Anti-infectives (From admission, onward)    Start     Dose/Rate Route Frequency Ordered Stop   09/09/23 1300  cefTRIAXone (ROCEPHIN) 2 g in sodium chloride 0.9 % 100 mL IVPB        2 g 200 mL/hr over 30 Minutes Intravenous  Once 09/09/23 1255         Assessment/Plan 38 y/o F who presented with abdominal pain and is now s/p lap chole 3/28 with +IOC  - GI consulted for possible ERCP - Regular diet - Out of bed and ambulate in the halls - Trend LFTs  FEN - Regular VTE - Lovenox ID - None indicated Dispo - Pending GI eval   LOS: 0 days   Tacy Learn Surgery 09/10/2023, 1:07 PM Please see Amion for pager number during day hours 7:00am-4:30pm or 7:00am -11:30am on weekends

## 2023-09-10 NOTE — H&P (View-Only) (Signed)
 Attending physician's note   I have taken a history, reviewed the chart, and examined the patient. I performed a substantive portion of this encounter, including complete performance of at least one of the key components, in conjunction with the APP. I agree with the APP's note, impression, and recommendations with my edits.   38 year old female admitted with upper abdominal pain radiating to the back along with nausea/vomiting and diagnosed with acute cholecystitis.  Underwent lap ccy with IOC yesterday.  IOC positive with persistent filling defect in the distal CBD.  There is also note of minimal leak at the level of the cystic duct.  Pain overall improved today, but still present.  Did tolerate breakfast this morning.  Labs today notable for the following: - AST/ALT 417/636, ALP 139, T. bili 0.9 (all normal on admission) - WBC 12, otherwise remainder of CBC normal  1) Choledocholithiasis 2) RUQ pain 38 year old female admitted with Acute cholecystitis now s/p lap ccy, with IOC+ - Plan for ERCP.  Tentatively scheduled for tomorrow with Dr. Elnoria Howard - N.p.o. at midnight - Repeat liver enzymes and CBC in the morning  The indications, risks, and benefits of ERCP were explained to the patient in detail. Risks include but are not limited to bleeding, perforation, adverse reaction to medications, pancreatitis, inability to cannulate duct, and cardiopulmonary compromise. Sequelae include but are not limited to the possibility of surgery, prolonged hospitalization, and mortality. The patient verbalized understanding and wishes to proceed. All questions answered to the best of my ability.   [address] DO, FACG [phone] office         Consultation  Referring Provider: [facility] Primary Care Physician:  Pcp, No Primary Gastroenterologist:  unassigned  Reason for Consultation: Choledocholithiasis   HPI: Erin Henry is a 38 y.o. non-English-speaking  Hispanic female, generally in good health who presented to the emergency room yesterday after onset of abdominal pain in the upper abdomen which began the previous evening and was persisting.  Pain was primarily in the right upper quadrant with radiation to her back and associated with nausea and vomiting. Workup in the emergency room with abdominal ultrasound showed multiple gallstones largest 1.7 cm and a distended gallbladder with wall thickness of 2 to 3 mm, and CBD of 10 to 11 mm with some intrahepatic biliary dilation also noted liver with increased parenchymal echogenicity.  Labs with WBC of 12.8/hemoglobin 12.9/hematocrit 38 Lipase within normal limits  Potassium 3.6 LFTs entirely normal  She underwent laparoscopic cholecystectomy yesterday afternoon with finding of acute cholecystitis.  Because of the finding of a dilated common bile duct IOC was done and this shows persistent filling defect in the distal common bile duct although some contrast did pass into the duodenum and there was minimal leak at the level of the cystic duct  Labs today WBC 12.0/hemoglobin 12.8/hematocrit 36.5 T. bili 0.9/alk phos 139/AST 417/ALT 636  She is still having quite a bit of abdominal pain today but says this is improved over yesterday.  Primarily tender in the right upper quadrant over one of the incisional ports.  Patient was interviewed with the assistance of the mobile interpreter.     Past Medical History:  Diagnosis Date   Medical history non-contributory     Past Surgical History:  Procedure Laterality Date   BREAST CYST EXCISION     CHOLECYSTECTOMY N/A 09/09/2023   Procedure: LAPAROSCOPIC CHOLECYSTECTOMY WITH INTRAOPERATIVE CHOLANGIOGRAM;  Surgeon: Moise Boring, MD;  Location: [facility] OR;  Service: General;  Laterality: N/A;    Prior to Admission medications   Medication Sig Start Date End Date Taking? Authorizing Provider  ibuprofen (ADVIL) 200 MG tablet Take 400 mg by mouth every 6  (six) hours as needed for headache, fever, moderate pain (pain score 4-6), mild pain (pain score 1-3) or cramping.   Yes [provider]  OMEPRAZOLE PO Take 2 tablets by mouth as needed.   Yes [provider]  Prenatal Vit-Fe Fumarate-FA (MULTIVITAMIN-PRENATAL) 27-0.8 MG TABS tablet Take 1 tablet by mouth daily at 12 noon.  Patient not taking: Reported on 09/09/2023    [provider]    Current Facility-Administered Medications  Medication Dose Route Frequency Provider Last Rate Last Admin   acetaminophen (TYLENOL) tablet 1,000 mg  1,000 mg Oral On Call to OR Barnetta Chapel, PA-C       acetaminophen (TYLENOL) tablet 1,000 mg  1,000 mg Oral Q6H Eric Form, PA-C   1,000 mg at 09/10/23 1228   bisacodyl (DULCOLAX) EC tablet 5 mg  5 mg Oral Daily PRN Eric Form, PA-C       cefTRIAXone (ROCEPHIN) 2 g in sodium chloride 0.9 % 100 mL IVPB  2 g Intravenous Once Renne Crigler, PA-C       diphenhydrAMINE (BENADRYL) capsule 25 mg  25 mg Oral Q6H PRN Eric Form, PA-C       Or   diphenhydrAMINE (BENADRYL) injection 25 mg  25 mg Intravenous Q6H PRN Eric Form, PA-C       docusate sodium (COLACE) capsule 100 mg  100 mg Oral BID Eric Form, PA-C   100 mg at 09/10/23 0931   enoxaparin (LOVENOX) injection 40 mg  40 mg Subcutaneous Q24H Eric Form, PA-C   40 mg at 09/10/23 1610   HYDROmorphone (DILAUDID) injection 0.5 mg  0.5 mg Intravenous Q3H PRN Eric Form, PA-C   0.5 mg at 09/09/23 2016   melatonin tablet 3 mg  3 mg Oral QHS PRN Eric Form, PA-C       methocarbamol (ROBAXIN) tablet 500 mg  500 mg Oral Q8H PRN Eric Form, PA-C       Or   methocarbamol (ROBAXIN) injection 500 mg  500 mg Intravenous Q8H PRN Eric Form, PA-C       metoprolol tartrate (LOPRESSOR) injection 5 mg  5 mg Intravenous Q6H PRN Eric Form, PA-C       ondansetron (ZOFRAN-ODT) disintegrating tablet 4 mg  4 mg Oral Q6H PRN Eric Form, PA-C       Or   ondansetron [facility]) injection 4 mg  4 mg Intravenous Q6H PRN Eric Form, PA-C   4 mg at 09/09/23 2119   oxyCODONE (Oxy IR/ROXICODONE) immediate release tablet 5-10 mg  5-10 mg Oral Q4H PRN Eric Form, PA-C   10 mg at 09/10/23 0931   polyethylene glycol (MIRALAX / GLYCOLAX) packet 17 g  17 g Oral Daily PRN Eric Form, PA-C   17 g at 09/10/23 9604   prenatal multivitamin tablet 1 tablet  1 tablet Oral Q1200 Eric Form, PA-C   1 tablet at 09/10/23 0932   simethicone (MYLICON) chewable tablet 40 mg  40 mg Oral Q6H PRN Eric Form, PA-C        Allergies as of 09/09/2023   (No Known Allergies)    Family History  Problem Relation Age of Onset   Diabetes Mother     Social History  Socioeconomic History   Marital status: Single    Spouse name: Not on file   Number of children: Not on file   Years of education: Not on file   Highest education level: Not on file  Occupational History   Not on file  Tobacco Use   Smoking status: Never   Smokeless tobacco: Never  Substance and Sexual Activity   Alcohol use: No   Drug use: No   Sexual activity: Not Currently  Other Topics Concern   Not on file  Social History Narrative   Not on file   Social Drivers of Health   Financial Resource Strain: Not on file  Food Insecurity: No Food Insecurity (09/10/2023)   Hunger Vital Sign    Worried About Running Out of Food in the Last Year: Never true    Ran Out of Food in the Last Year: Never true  Transportation Needs: No Transportation Needs (09/10/2023)   PRAPARE - Administrator, Civil Service (Medical): No    Lack of Transportation (Non-Medical): No  Physical Activity: Not on file  Stress: Not on file  Social Connections: Not on file  Intimate Partner Violence: Not At Risk (09/10/2023)   Humiliation, Afraid, Rape, and Kick questionnaire    Fear of Current or Ex-Partner: No    Emotionally Abused: No    Physically Abused: No     Sexually Abused: No    Review of Systems: Pertinent positive and negative review of systems were noted in the above HPI section.  All other review of systems was otherwise negative.   Physical Exam: Vital signs in last 24 hours: Temp:  [97.9 F (36.6 C)-98.7 F (37.1 C)] 98.3 F (36.8 C) (03/28 0722) Pulse Rate:  [63-98] 67 (03/28 0722) Resp:  [12-20] 16 (03/28 0722) BP: (107-149)/(71-92) 107/71 (03/28 0722) SpO2:  [54 %-100 %] 100 % (03/28 0722) Weight:  [87.5 kg] 87.5 kg (03/27 1429) Last BM Date : 09/08/23 General:   Alert,  Well-developed, well-nourished, young Hispanic female pleasant and cooperative in NAD, partner at bedside-patient uncomfortable Head:  Normocephalic and atraumatic. Eyes:  Sclera clear, no icterus.   Conjunctiva pink. Ears:  Normal auditory acuity. Nose:  No deformity, discharge,  or lesions. Mouth:  No deformity or lesions.   Neck:  Supple; no masses or thyromegaly. Lungs:  Clear throughout to auscultation.   No wheezes, crackles, or rhonchi.  Heart:  Regular rate and rhythm; no murmurs, clicks, rubs,  or gallops. Abdomen:  Soft, quite tender in the right upper quadrant with guarding, no rebound, BS active,nonpalp mass or hsm.   Rectal: Not done Msk:  Symmetrical without gross deformities. . Pulses:  Normal pulses noted. Extremities:  Without clubbing or edema. Neurologic:  Alert and  oriented x4;  grossly normal neurologically. Skin:  Intact without significant lesions or rashes.. Psych:  Alert and cooperative. Normal mood and affect.  Intake/Output from previous day: 03/27 0701 - 03/28 0700 In: 1440 [P.O.:240; I.V.:1200] Out: 25 [Blood:25] Intake/Output this shift: No intake/output data recorded.  Lab Results: Recent Labs    09/09/23 1216 09/10/23 0440  WBC 12.8* 12.0*  HGB 12.9 12.8  HCT 38.0 36.5  PLT 267 283   BMET Recent Labs    09/09/23 1140 09/10/23 0440  NA 135 138  K 3.6 3.8  CL 103 103  CO2 22 24  GLUCOSE 128* 138*   BUN 20 11  CREATININE 0.58 0.60  CALCIUM 8.8* 9.3   LFT Recent Labs  09/10/23 0440  PROT 7.1  ALBUMIN 3.6  AST 417*  ALT 636*  ALKPHOS 139*  BILITOT 0.9   PT/INR No results for input(s): "LABPROT", "INR" in the last 72 hours. Hepatitis Panel No results for input(s): "HEPBSAG", "HCVAB", "HEPAIGM", "HEPBIGM" in the last 72 hours.    IMPRESSION:  #75 38 year old Hispanic female who presented with acute epigastric/right upper quadrant pain nausea and vomiting and was found on ultrasound to have multiple gallstones distended GB, dilated CBD.  And positive Murphy sign.   Initial LFTs were normal  She underwent laparoscopic cholecystectomy yesterday with finding of acute cholecystitis, and because of the dilated common bile duct IOC was done which shows a persistent filling defect in the distal common bile duct although some contrast did pass into the duodenum, there was minimal leak at the level of the cystic duct.  Today patient has had sharp rising LFTs, T. bili still normal  She is still quite uncomfortable but says she feels better than she did on admission.  Plan; okay for diet today as per surgery, n.p.o. after midnight We will schedule her for ERCP with stone extraction down and also evaluate for potential bile leak and stent if necessary..  This will be scheduled for Dr. Elnoria Howard tomorrow 09/11/2023. We reviewed the procedure in detail, reviewed biliary images with the patient, reviewed indications risks and benefits including potential risk of bleeding, perforation, pancreatitis and procedure failure.  This was done with the assistance of the mobile video interpreter.  Patient voices understanding and agrees to procedure.  Repeat labs in a.m. Continue Rocephin GI will follow with you   Amy EsterwoodPA-C  09/10/2023, 1:52 PM

## 2023-09-10 NOTE — Plan of Care (Signed)
  Problem: Education: Goal: Knowledge of General Education information will improve Description: Including pain rating scale, medication(s)/side effects and non-pharmacologic comfort measures Outcome: Progressing   Problem: Health Behavior/Discharge Planning: Goal: Ability to manage health-related needs will improve Outcome: Progressing   Problem: Clinical Measurements: Goal: Ability to maintain clinical measurements within normal limits will improve Outcome: Progressing Goal: Will remain free from infection Outcome: Progressing   Problem: Activity: Goal: Risk for activity intolerance will decrease Outcome: Progressing   Problem: Nutrition: Goal: Adequate nutrition will be maintained Outcome: Progressing   Problem: Coping: Goal: Level of anxiety will decrease Outcome: Progressing   Problem: Pain Managment: Goal: General experience of comfort will improve and/or be controlled Outcome: Progressing   Problem: Safety: Goal: Ability to remain free from injury will improve Outcome: Progressing

## 2023-09-10 NOTE — TOC Initial Note (Addendum)
 Transition of Care (TOC) - Initial/Assessment Note   Spoke to patient at bedside via [employer] interpreter Fausto Skillern [phone]   Patient from home with husband.   Patient does not have insurance. Patient consented to referral to financial counselor. Referral sent.    Discussed TOC Pharmacy at discharge. MD can send prescriptions to [facility]. TOC Pharmacy will call patient at discharge with cost. Patient can pay cash or card and medications will be delivered to her prior to her leaving. Patient in agreement. Pharmacy changed to [facility]   Called multiple [facility] , first available is with Hyman Hopes NP , in [facility]. Information placed on AVS    Patient Details  Name: Erin Henry MRN: [id number] Date of Birth: 1985/07/11  Transition of Care [facility]) CM/SW Contact:    Kingsley Plan, RN Phone Number: 09/10/2023, 2:28 PM  Clinical Narrative:                   Expected Discharge Plan: Home/Self Care Barriers to Discharge: Continued Medical Work up   Patient Goals and CMS Choice Patient states their goals for this hospitalization and ongoing recovery are:: to return to home   Choice offered to / list presented to : NA      Expected Discharge Plan and Services In-house Referral: Financial Counselor Discharge Planning Services: CM Consult Post Acute Care Choice: NA Living arrangements for the past 2 months: Single Family Home                 DME Arranged: N/A DME Agency: NA       HH Arranged: NA HH Agency: NA        Prior Living Arrangements/Services Living arrangements for the past 2 months: Single Family Home Lives with:: Spouse Patient language and need for interpreter reviewed:: Yes Do you feel safe going back to the place where you live?: Yes      Need for Family Participation in Patient Care: Yes (Comment) Care giver support system in place?: Yes (comment)   Criminal Activity/Legal Involvement Pertinent to Current Situation/Hospitalization: No -  Comment as needed  Activities of Daily Living   ADL Screening (condition at time of admission) Independently performs ADLs?: Yes (appropriate for developmental age) Is the patient deaf or have difficulty hearing?: No Does the patient have difficulty seeing, even when wearing glasses/contacts?: No Does the patient have difficulty concentrating, remembering, or making decisions?: No  Permission Sought/Granted   Permission granted to share information with : Yes, Verbal Permission Granted     Permission granted to share info w AGENCY: Wants a [facility] PCP        Emotional Assessment Appearance:: Appears stated age Attitude/Demeanor/Rapport: Engaged Affect (typically observed): Appropriate Orientation: : Oriented to Self, Oriented to Place, Oriented to  Time, Oriented to Situation Alcohol / Substance Use: Not Applicable Psych Involvement: No (comment)  Admission diagnosis:  Acute cholecystitis due to biliary calculus [K80.00] Cholecystitis, acute with cholelithiasis [K80.00] Patient Active Problem List   Diagnosis Date Noted   Cholecystitis, acute with cholelithiasis 09/09/2023   PCP:  Pcp, No Pharmacy:   [id number] [place], [place] - [address] AT [facility] [address] [place] [address] Phone: [phone] Fax: [phone]  [facility] [address] [place] [address] Phone: [phone] Fax: [phone]     Social Drivers of Health (SDOH) Social History: SDOH Screenings   Food Insecurity: No Food Insecurity (09/10/2023)  Housing: Low Risk  (09/10/2023)  Transportation Needs: No Transportation Needs (09/10/2023)  Utilities: Not At Risk (09/10/2023)  Tobacco Use: Low Risk  (09/09/2023)   SDOH Interventions:     Readmission Risk Interventions     No data to display

## 2023-09-10 NOTE — Consult Note (Addendum)
 Attending physician's note   I have taken a history, reviewed the chart, and examined the patient. I performed a substantive portion of this encounter, including complete performance of at least one of the key components, in conjunction with the APP. I agree with the APP's note, impression, and recommendations with my edits.   38 year old female admitted with upper abdominal pain radiating to the back along with nausea/vomiting and diagnosed with acute cholecystitis.  Underwent lap ccy with IOC yesterday.  IOC positive with persistent filling defect in the distal CBD.  There is also note of minimal leak at the level of the cystic duct.  Pain overall improved today, but still present.  Did tolerate breakfast this morning.  Labs today notable for the following: - AST/ALT 417/636, ALP 139, T. bili 0.9 (all normal on admission) - WBC 12, otherwise remainder of CBC normal  1) Choledocholithiasis 2) RUQ pain 38 year old female admitted with Acute cholecystitis now s/p lap ccy, with IOC+ - Plan for ERCP.  Tentatively scheduled for tomorrow with Dr. Elnoria Howard - N.p.o. at midnight - Repeat liver enzymes and CBC in the morning  The indications, risks, and benefits of ERCP were explained to the patient in detail. Risks include but are not limited to bleeding, perforation, adverse reaction to medications, pancreatitis, inability to cannulate duct, and cardiopulmonary compromise. Sequelae include but are not limited to the possibility of surgery, prolonged hospitalization, and mortality. The patient verbalized understanding and wishes to proceed. All questions answered to the best of my ability.   [address] DO, FACG [phone] office         Consultation  Referring Provider: [facility] Primary Care Physician:  Pcp, No Primary Gastroenterologist:  unassigned  Reason for Consultation: Choledocholithiasis   HPI: Erin Henry is a 38 y.o. non-English-speaking  Hispanic female, generally in good health who presented to the emergency room yesterday after onset of abdominal pain in the upper abdomen which began the previous evening and was persisting.  Pain was primarily in the right upper quadrant with radiation to her back and associated with nausea and vomiting. Workup in the emergency room with abdominal ultrasound showed multiple gallstones largest 1.7 cm and a distended gallbladder with wall thickness of 2 to 3 mm, and CBD of 10 to 11 mm with some intrahepatic biliary dilation also noted liver with increased parenchymal echogenicity.  Labs with WBC of 12.8/hemoglobin 12.9/hematocrit 38 Lipase within normal limits  Potassium 3.6 LFTs entirely normal  She underwent laparoscopic cholecystectomy yesterday afternoon with finding of acute cholecystitis.  Because of the finding of a dilated common bile duct IOC was done and this shows persistent filling defect in the distal common bile duct although some contrast did pass into the duodenum and there was minimal leak at the level of the cystic duct  Labs today WBC 12.0/hemoglobin 12.8/hematocrit 36.5 T. bili 0.9/alk phos 139/AST 417/ALT 636  She is still having quite a bit of abdominal pain today but says this is improved over yesterday.  Primarily tender in the right upper quadrant over one of the incisional ports.  Patient was interviewed with the assistance of the mobile interpreter.     Past Medical History:  Diagnosis Date   Medical history non-contributory     Past Surgical History:  Procedure Laterality Date   BREAST CYST EXCISION     CHOLECYSTECTOMY N/A 09/09/2023   Procedure: LAPAROSCOPIC CHOLECYSTECTOMY WITH INTRAOPERATIVE CHOLANGIOGRAM;  Surgeon: Moise Boring, MD;  Location: [facility] OR;  Service: General;  Laterality: N/A;    Prior to Admission medications   Medication Sig Start Date End Date Taking? Authorizing Provider  ibuprofen (ADVIL) 200 MG tablet Take 400 mg by mouth every 6  (six) hours as needed for headache, fever, moderate pain (pain score 4-6), mild pain (pain score 1-3) or cramping.   Yes [provider]  OMEPRAZOLE PO Take 2 tablets by mouth as needed.   Yes [provider]  Prenatal Vit-Fe Fumarate-FA (MULTIVITAMIN-PRENATAL) 27-0.8 MG TABS tablet Take 1 tablet by mouth daily at 12 noon.  Patient not taking: Reported on 09/09/2023    [provider]    Current Facility-Administered Medications  Medication Dose Route Frequency Provider Last Rate Last Admin   acetaminophen (TYLENOL) tablet 1,000 mg  1,000 mg Oral On Call to OR Barnetta Chapel, PA-C       acetaminophen (TYLENOL) tablet 1,000 mg  1,000 mg Oral Q6H Eric Form, PA-C   1,000 mg at 09/10/23 1228   bisacodyl (DULCOLAX) EC tablet 5 mg  5 mg Oral Daily PRN Eric Form, PA-C       cefTRIAXone (ROCEPHIN) 2 g in sodium chloride 0.9 % 100 mL IVPB  2 g Intravenous Once Renne Crigler, PA-C       diphenhydrAMINE (BENADRYL) capsule 25 mg  25 mg Oral Q6H PRN Eric Form, PA-C       Or   diphenhydrAMINE (BENADRYL) injection 25 mg  25 mg Intravenous Q6H PRN Eric Form, PA-C       docusate sodium (COLACE) capsule 100 mg  100 mg Oral BID Eric Form, PA-C   100 mg at 09/10/23 0931   enoxaparin (LOVENOX) injection 40 mg  40 mg Subcutaneous Q24H Eric Form, PA-C   40 mg at 09/10/23 1610   HYDROmorphone (DILAUDID) injection 0.5 mg  0.5 mg Intravenous Q3H PRN Eric Form, PA-C   0.5 mg at 09/09/23 2016   melatonin tablet 3 mg  3 mg Oral QHS PRN Eric Form, PA-C       methocarbamol (ROBAXIN) tablet 500 mg  500 mg Oral Q8H PRN Eric Form, PA-C       Or   methocarbamol (ROBAXIN) injection 500 mg  500 mg Intravenous Q8H PRN Eric Form, PA-C       metoprolol tartrate (LOPRESSOR) injection 5 mg  5 mg Intravenous Q6H PRN Eric Form, PA-C       ondansetron (ZOFRAN-ODT) disintegrating tablet 4 mg  4 mg Oral Q6H PRN Eric Form, PA-C       Or   ondansetron [facility]) injection 4 mg  4 mg Intravenous Q6H PRN Eric Form, PA-C   4 mg at 09/09/23 2119   oxyCODONE (Oxy IR/ROXICODONE) immediate release tablet 5-10 mg  5-10 mg Oral Q4H PRN Eric Form, PA-C   10 mg at 09/10/23 0931   polyethylene glycol (MIRALAX / GLYCOLAX) packet 17 g  17 g Oral Daily PRN Eric Form, PA-C   17 g at 09/10/23 9604   prenatal multivitamin tablet 1 tablet  1 tablet Oral Q1200 Eric Form, PA-C   1 tablet at 09/10/23 0932   simethicone (MYLICON) chewable tablet 40 mg  40 mg Oral Q6H PRN Eric Form, PA-C        Allergies as of 09/09/2023   (No Known Allergies)    Family History  Problem Relation Age of Onset   Diabetes Mother     Social History  Socioeconomic History   Marital status: Single    Spouse name: Not on file   Number of children: Not on file   Years of education: Not on file   Highest education level: Not on file  Occupational History   Not on file  Tobacco Use   Smoking status: Never   Smokeless tobacco: Never  Substance and Sexual Activity   Alcohol use: No   Drug use: No   Sexual activity: Not Currently  Other Topics Concern   Not on file  Social History Narrative   Not on file   Social Drivers of Health   Financial Resource Strain: Not on file  Food Insecurity: No Food Insecurity (09/10/2023)   Hunger Vital Sign    Worried About Running Out of Food in the Last Year: Never true    Ran Out of Food in the Last Year: Never true  Transportation Needs: No Transportation Needs (09/10/2023)   PRAPARE - Administrator, Civil Service (Medical): No    Lack of Transportation (Non-Medical): No  Physical Activity: Not on file  Stress: Not on file  Social Connections: Not on file  Intimate Partner Violence: Not At Risk (09/10/2023)   Humiliation, Afraid, Rape, and Kick questionnaire    Fear of Current or Ex-Partner: No    Emotionally Abused: No    Physically Abused: No     Sexually Abused: No    Review of Systems: Pertinent positive and negative review of systems were noted in the above HPI section.  All other review of systems was otherwise negative.   Physical Exam: Vital signs in last 24 hours: Temp:  [97.9 F (36.6 C)-98.7 F (37.1 C)] 98.3 F (36.8 C) (03/28 0722) Pulse Rate:  [63-98] 67 (03/28 0722) Resp:  [12-20] 16 (03/28 0722) BP: (107-149)/(71-92) 107/71 (03/28 0722) SpO2:  [54 %-100 %] 100 % (03/28 0722) Weight:  [87.5 kg] 87.5 kg (03/27 1429) Last BM Date : 09/08/23 General:   Alert,  Well-developed, well-nourished, young Hispanic female pleasant and cooperative in NAD, partner at bedside-patient uncomfortable Head:  Normocephalic and atraumatic. Eyes:  Sclera clear, no icterus.   Conjunctiva pink. Ears:  Normal auditory acuity. Nose:  No deformity, discharge,  or lesions. Mouth:  No deformity or lesions.   Neck:  Supple; no masses or thyromegaly. Lungs:  Clear throughout to auscultation.   No wheezes, crackles, or rhonchi.  Heart:  Regular rate and rhythm; no murmurs, clicks, rubs,  or gallops. Abdomen:  Soft, quite tender in the right upper quadrant with guarding, no rebound, BS active,nonpalp mass or hsm.   Rectal: Not done Msk:  Symmetrical without gross deformities. . Pulses:  Normal pulses noted. Extremities:  Without clubbing or edema. Neurologic:  Alert and  oriented x4;  grossly normal neurologically. Skin:  Intact without significant lesions or rashes.. Psych:  Alert and cooperative. Normal mood and affect.  Intake/Output from previous day: 03/27 0701 - 03/28 0700 In: 1440 [P.O.:240; I.V.:1200] Out: 25 [Blood:25] Intake/Output this shift: No intake/output data recorded.  Lab Results: Recent Labs    09/09/23 1216 09/10/23 0440  WBC 12.8* 12.0*  HGB 12.9 12.8  HCT 38.0 36.5  PLT 267 283   BMET Recent Labs    09/09/23 1140 09/10/23 0440  NA 135 138  K 3.6 3.8  CL 103 103  CO2 22 24  GLUCOSE 128* 138*   BUN 20 11  CREATININE 0.58 0.60  CALCIUM 8.8* 9.3   LFT Recent Labs  09/10/23 0440  PROT 7.1  ALBUMIN 3.6  AST 417*  ALT 636*  ALKPHOS 139*  BILITOT 0.9   PT/INR No results for input(s): "LABPROT", "INR" in the last 72 hours. Hepatitis Panel No results for input(s): "HEPBSAG", "HCVAB", "HEPAIGM", "HEPBIGM" in the last 72 hours.    IMPRESSION:  #75 38 year old Hispanic female who presented with acute epigastric/right upper quadrant pain nausea and vomiting and was found on ultrasound to have multiple gallstones distended GB, dilated CBD.  And positive Murphy sign.   Initial LFTs were normal  She underwent laparoscopic cholecystectomy yesterday with finding of acute cholecystitis, and because of the dilated common bile duct IOC was done which shows a persistent filling defect in the distal common bile duct although some contrast did pass into the duodenum, there was minimal leak at the level of the cystic duct.  Today patient has had sharp rising LFTs, T. bili still normal  She is still quite uncomfortable but says she feels better than she did on admission.  Plan; okay for diet today as per surgery, n.p.o. after midnight We will schedule her for ERCP with stone extraction down and also evaluate for potential bile leak and stent if necessary..  This will be scheduled for Dr. Elnoria Howard tomorrow 09/11/2023. We reviewed the procedure in detail, reviewed biliary images with the patient, reviewed indications risks and benefits including potential risk of bleeding, perforation, pancreatitis and procedure failure.  This was done with the assistance of the mobile video interpreter.  Patient voices understanding and agrees to procedure.  Repeat labs in a.m. Continue Rocephin GI will follow with you   Amy EsterwoodPA-C  09/10/2023, 1:52 PM

## 2023-09-10 NOTE — Anesthesia Preprocedure Evaluation (Addendum)
 Anesthesia Evaluation  Patient identified by MRN, date of birth, ID band Patient awake    Reviewed: Allergy & Precautions, NPO status , Patient's Chart, lab work & pertinent test results  Airway Mallampati: III  TM Distance: >3 FB Neck ROM: Full    Dental  (+) Dental Advisory Given, Teeth Intact   Pulmonary neg pulmonary ROS   Pulmonary exam normal breath sounds clear to auscultation       Cardiovascular negative cardio ROS Normal cardiovascular exam Rhythm:Regular Rate:Normal     Neuro/Psych negative neurological ROS  negative psych ROS   GI/Hepatic negative GI ROS, Neg liver ROS,,,  Endo/Other  negative endocrine ROS    Renal/GU negative Renal ROS     Musculoskeletal negative musculoskeletal ROS (+)    Abdominal  (+) + obese  Peds  Hematology negative hematology ROS (+)   Anesthesia Other Findings cholelithiasis  Reproductive/Obstetrics (+) Breast feeding  Hcg negative                             Anesthesia Physical Anesthesia Plan  ASA: 2  Anesthesia Plan: General   Post-op Pain Management: Minimal or no pain anticipated   Induction: Intravenous  PONV Risk Score and Plan: 4 or greater and Ondansetron, Dexamethasone, Midazolam, Treatment may vary due to age or medical condition and Scopolamine patch - Pre-op  Airway Management Planned: Oral ETT  Additional Equipment:   Intra-op Plan:   Post-operative Plan: Extubation in OR  Informed Consent: I have reviewed the patients History and Physical, chart, labs and discussed the procedure including the risks, benefits and alternatives for the proposed anesthesia with the patient or authorized representative who has indicated his/her understanding and acceptance.     Dental advisory given  Plan Discussed with: CRNA  Anesthesia Plan Comments:        Anesthesia Quick Evaluation

## 2023-09-11 ENCOUNTER — Inpatient Hospital Stay (HOSPITAL_COMMUNITY): Payer: MEDICAID

## 2023-09-11 ENCOUNTER — Inpatient Hospital Stay (HOSPITAL_COMMUNITY): Payer: MEDICAID | Admitting: Anesthesiology

## 2023-09-11 ENCOUNTER — Encounter (HOSPITAL_COMMUNITY): Payer: Self-pay | Admitting: General Surgery

## 2023-09-11 ENCOUNTER — Encounter (HOSPITAL_COMMUNITY): Admission: EM | Disposition: A | Discharge status: Home / Self Care | Payer: Self-pay | Source: Home / Self Care

## 2023-09-11 DIAGNOSIS — K8050 Calculus of bile duct without cholangitis or cholecystitis without obstruction: Secondary | ICD-10-CM

## 2023-09-11 HISTORY — PX: SPHINCTEROTOMY: SHX5279

## 2023-09-11 HISTORY — PX: ERCP: SHX5425

## 2023-09-11 HISTORY — PX: STONE EXTRACTION WITH BASKET: SHX5318

## 2023-09-11 LAB — CBC WITH DIFFERENTIAL/PLATELET
Abs Immature Granulocytes: 0.03 10*3/uL (ref 0.00–0.07)
Basophils Absolute: 0.1 10*3/uL (ref 0.0–0.1)
Basophils Relative: 1 %
Eosinophils Absolute: 0.2 10*3/uL (ref 0.0–0.5)
Eosinophils Relative: 2 %
HCT: 36 % (ref 36.0–46.0)
Hemoglobin: 12.3 g/dL (ref 12.0–15.0)
Immature Granulocytes: 0 %
Lymphocytes Relative: 30 %
Lymphs Abs: 2.6 10*3/uL (ref 0.7–4.0)
MCH: 29.8 pg (ref 26.0–34.0)
MCHC: 34.2 g/dL (ref 30.0–36.0)
MCV: 87.2 fL (ref 80.0–100.0)
Monocytes Absolute: 0.8 10*3/uL (ref 0.1–1.0)
Monocytes Relative: 9 %
Neutro Abs: 5 10*3/uL (ref 1.7–7.7)
Neutrophils Relative %: 58 %
Platelets: 251 10*3/uL (ref 150–400)
RBC: 4.13 MIL/uL (ref 3.87–5.11)
RDW: 12.9 % (ref 11.5–15.5)
WBC: 8.7 10*3/uL (ref 4.0–10.5)
nRBC: 0 % (ref 0.0–0.2)

## 2023-09-11 LAB — PROTIME-INR
INR: 1 (ref 0.8–1.2)
Prothrombin Time: 13.4 s (ref 11.4–15.2)

## 2023-09-11 SURGERY — ERCP, WITH INTERVENTION IF INDICATED
Anesthesia: General

## 2023-09-11 MED ORDER — DICLOFENAC SUPPOSITORY 100 MG
RECTAL | Status: DC | PRN
  Administered 2023-09-11: 100 mg via RECTAL

## 2023-09-11 MED ORDER — ONDANSETRON HCL 4 MG/2ML IJ SOLN
INTRAMUSCULAR | Status: DC | PRN
  Administered 2023-09-11: 4 mg via INTRAVENOUS

## 2023-09-11 MED ORDER — LACTATED RINGERS IV SOLN: INTRAVENOUS | Status: DC | PRN

## 2023-09-11 MED ORDER — SODIUM CHLORIDE 0.9 % IV SOLN
INTRAVENOUS | Status: DC | PRN
  Administered 2023-09-11: 20 mL

## 2023-09-11 MED ORDER — LIDOCAINE HCL (CARDIAC) PF 100 MG/5ML IV SOSY
PREFILLED_SYRINGE | INTRAVENOUS | Status: DC | PRN
  Administered 2023-09-11: 80 mg via INTRATRACHEAL

## 2023-09-11 MED ORDER — SUGAMMADEX SODIUM 200 MG/2ML IV SOLN
INTRAVENOUS | Status: DC | PRN
  Administered 2023-09-11: 200 mg via INTRAVENOUS

## 2023-09-11 MED ORDER — GLUCAGON HCL RDNA (DIAGNOSTIC) 1 MG IJ SOLR
INTRAMUSCULAR | Status: AC
  Filled 2023-09-11: qty 1

## 2023-09-11 MED ORDER — FENTANYL CITRATE (PF) 100 MCG/2ML IJ SOLN: 25.0000 ug | INTRAMUSCULAR | Status: DC | PRN

## 2023-09-11 MED ORDER — CIPROFLOXACIN IN D5W 400 MG/200ML IV SOLN
INTRAVENOUS | Status: DC | PRN
  Administered 2023-09-11: 400 mg via INTRAVENOUS

## 2023-09-11 MED ORDER — DEXAMETHASONE SODIUM PHOSPHATE 10 MG/ML IJ SOLN
INTRAMUSCULAR | Status: DC | PRN
  Administered 2023-09-11: 10 mg via INTRAVENOUS

## 2023-09-11 MED ORDER — PHENYLEPHRINE HCL (PRESSORS) 10 MG/ML IV SOLN
INTRAVENOUS | Status: DC | PRN
  Administered 2023-09-11 (×2): 80 ug via INTRAVENOUS

## 2023-09-11 MED ORDER — DICLOFENAC SUPPOSITORY 100 MG
RECTAL | Status: AC
  Filled 2023-09-11: qty 1

## 2023-09-11 MED ORDER — DROPERIDOL 2.5 MG/ML IJ SOLN: 0.6250 mg | Freq: Once | INTRAMUSCULAR | Status: DC | PRN

## 2023-09-11 MED ORDER — MIDAZOLAM HCL 5 MG/5ML IJ SOLN
INTRAMUSCULAR | Status: DC | PRN
  Administered 2023-09-11: 2 mg via INTRAVENOUS

## 2023-09-11 MED ORDER — PROPOFOL 10 MG/ML IV BOLUS
INTRAVENOUS | Status: DC | PRN
  Administered 2023-09-11: 150 mg via INTRAVENOUS

## 2023-09-11 MED ORDER — ROCURONIUM BROMIDE 100 MG/10ML IV SOLN
INTRAVENOUS | Status: DC | PRN
  Administered 2023-09-11: 40 mg via INTRAVENOUS

## 2023-09-11 MED ORDER — FENTANYL CITRATE (PF) 100 MCG/2ML IJ SOLN
INTRAMUSCULAR | Status: AC
  Filled 2023-09-11: qty 2

## 2023-09-11 MED ORDER — FENTANYL CITRATE (PF) 100 MCG/2ML IJ SOLN
INTRAMUSCULAR | Status: DC | PRN
  Administered 2023-09-11: 100 ug via INTRAVENOUS

## 2023-09-11 MED ORDER — CIPROFLOXACIN IN D5W 400 MG/200ML IV SOLN
INTRAVENOUS | Status: AC
  Filled 2023-09-11: qty 200

## 2023-09-11 MED ORDER — MIDAZOLAM HCL 2 MG/2ML IJ SOLN
INTRAMUSCULAR | Status: AC
  Filled 2023-09-11: qty 2

## 2023-09-11 NOTE — Transfer of Care (Signed)
 Immediate Anesthesia Transfer of Care Note  Patient: Erin Henry  Procedure(s) Performed: ERCP, WITH INTERVENTION IF INDICATED SPHINCTEROTOMY, Biliary ERCP, with REMOVAL OF STONES  Patient Location: PACU  Anesthesia Type:General  Level of Consciousness: awake, alert , oriented, and patient cooperative  Airway & Oxygen Therapy: Patient Spontanous Breathing and Patient connected to face mask oxygen  Post-op Assessment: Report given to RN, Post -op Vital signs reviewed and stable, Patient moving all extremities, Patient moving all extremities X 4, and Patient able to stick tongue midline  Post vital signs: Reviewed and stable  Last Vitals:  Vitals Value Taken Time  BP 130/83 09/11/23 0901  Temp 37 C 09/11/23 0901  Pulse 74 09/11/23 0911  Resp 18 09/11/23 0911  SpO2 97 % 09/11/23 0911  Vitals shown include unfiled device data.  Last Pain:  Vitals:   09/11/23 0901  TempSrc:   PainSc: Asleep      Patients Stated Pain Goal: 4 (09/11/23 0014)  Complications: No notable events documented.

## 2023-09-11 NOTE — Interval H&P Note (Signed)
 History and Physical Interval Note:  09/11/2023 7:52 AM  Erin Henry  has presented today for surgery, with the diagnosis of Choledocholithiasis, possible small bilel leak.  The various methods of treatment have been discussed with the patient and family. After consideration of risks, benefits and other options for treatment, the patient has consented to  Procedure(s): ERCP, WITH INTERVENTION IF INDICATED (N/A) as a surgical intervention.  The patient's history has been reviewed, patient examined, no change in status, stable for surgery.  I have reviewed the patient's chart and labs.  Questions were answered to the patient's satisfaction.     Javen Ridings D

## 2023-09-11 NOTE — Plan of Care (Signed)
  Problem: Clinical Measurements: Goal: Ability to maintain clinical measurements within normal limits will improve Outcome: Progressing Goal: Will remain free from infection Outcome: Progressing Goal: Diagnostic test results will improve Outcome: Progressing Goal: Respiratory complications will improve Outcome: Progressing Goal: Cardiovascular complication will be avoided Outcome: Progressing   Problem: Nutrition: Goal: Adequate nutrition will be maintained Outcome: Progressing   Problem: Pain Managment: Goal: General experience of comfort will improve and/or be controlled Outcome: Progressing   Problem: Safety: Goal: Ability to remain free from injury will improve Outcome: Progressing   Problem: Skin Integrity: Goal: Risk for impaired skin integrity will decrease Outcome: Progressing

## 2023-09-11 NOTE — Progress Notes (Signed)
 Utilized [employer], Spanish interpreter, Maureen Ralphs id# [phone] assist with communicating with pt to administer medications and assess pain. Pt says she is having spasms. I reinforce to pt to the importance to ambulate in the room and to sit in the chair. Pt states that she has been walking to the bathroom. Per interpreter, Maureen Ralphs, pt doesn't need anything is OK at the moment.

## 2023-09-11 NOTE — Progress Notes (Signed)
 * Day of Surgery *   Subjective/Chief Complaint: Pt back from endoscopy. Doing well    Objective: Vital signs in last 24 hours: Temp:  [97.3 F (36.3 C)-98.7 F (37.1 C)] 97.6 F (36.4 C) (03/29 0930) Pulse Rate:  [61-72] 67 (03/29 0930) Resp:  [16-18] 17 (03/29 0930) BP: (95-130)/(55-83) 121/80 (03/29 0930) SpO2:  [95 %-100 %] 95 % (03/29 0930) Last BM Date : 09/08/23  Intake/Output from previous day: 03/28 0701 - 03/29 0700 In: 60 [P.O.:60] Out: -  Intake/Output this shift: Total I/O In: 300 [I.V.:100; IV Piggyback:200] Out: -   General appearance: alert and cooperative GI: soft, non-tender; bowel sounds normal; no masses,  no organomegaly and inc c/d/i  Lab Results:  Recent Labs    09/10/23 0440 09/11/23 0641  WBC 12.0* 8.7  HGB 12.8 12.3  HCT 36.5 36.0  PLT 283 251   BMET Recent Labs    09/10/23 0440 09/10/23 1540  NA 138 138  K 3.8 3.7  CL 103 103  CO2 24 27  GLUCOSE 138* 129*  BUN 11 14  CREATININE 0.60 0.78  CALCIUM 9.3 9.0   PT/INR Recent Labs    09/11/23 0641  LABPROT 13.4  INR 1.0   ABG No results for input(s): "PHART", "HCO3" in the last 72 hours.  Invalid input(s): "PCO2", "PO2"  Studies/Results: DG Cholangiogram Operative Result Date: 09/10/2023 CLINICAL DATA:  [id number] Surgery, elective [id number] EXAM: INTRAOPERATIVE CHOLANGIOGRAM TECHNIQUE: Cholangiographic images from the C-arm fluoroscopic device were submitted for interpretation post-operatively. Please see the procedural report for the amount of contrast and the fluoroscopy time utilized. COMPARISON:  None Available. FINDINGS: Persistent filling defect in the distal CBD although some contrast does pass into the duodenum. Minimal leak at the level of the cystic duct. IMPRESSION: 1. Incompletely obstructive choledocholithiasis Electronically Signed   By: Corlis Leak M.D.   On: 09/10/2023 08:57   US Abdomen Limited RUQ (LIVER/GB) Result Date: 09/09/2023 CLINICAL DATA:  Right upper  quadrant abdominal pain. EXAM: ULTRASOUND ABDOMEN LIMITED RIGHT UPPER QUADRANT COMPARISON:  None Available. FINDINGS: Gallbladder: Multiple shadowing echogenic foci, compatible with gallstones, with the largest measuring up to 1.7 cm. Distended gallbladder with wall thickness measuring up to 2-3 mm. Sonographic Murphy sign was positive as per the sonographer. Common bile duct: Diameter: 10-11 mm. Intrahepatic biliary dilatation is noted. Evaluation is somewhat limited secondary to shadowing from gallstones. Liver: No focal lesion identified. Liver demonstrates increased parenchymal echogenicity. Portal vein is patent on color Doppler imaging with normal direction of blood flow towards the liver. Other: None. IMPRESSION: 1. Distended gallbladder with multiple gallstones. No significant gallbladder wall thickening or pericholecystic fluid identified. Sonographic Murphy sign was positive as per the sonographer. Findings are equivocal for acute cholecystitis on this exam. Recommend clinical correlation to determine the need for further imaging with nuclear medicine HIDA scan. 2. Dilation of the intrahepatic biliary tree and common bile duct. Evaluation is somewhat limited secondary to shadowing from gallstones. Further evaluation with MRCP could be considered. 3. Increased hepatic parenchymal echogenicity, which is nonspecific but can be seen in the setting of hepatic steatosis or other hepatocellular disease processes. Recommend correlation with liver function tests. Electronically Signed   By: Hart Robinsons M.D.   On: 09/09/2023 12:10    Anti-infectives: Anti-infectives (From admission, onward)    Start     Dose/Rate Route Frequency Ordered Stop   09/09/23 1300  cefTRIAXone (ROCEPHIN) 2 g in sodium chloride 0.9 % 100 mL IVPB  2 g 200 mL/hr over 30 Minutes Intravenous  Once 09/09/23 1255         Assessment/Plan: s/p Procedure(s): ERCP, WITH INTERVENTION IF INDICATED (N/A) SPHINCTEROTOMY,  Biliary ERCP, with REMOVAL OF STONES -mobilize -recheck labs in AM -home tomorrow if doing well   LOS: 1 day    Erin Henry 09/11/2023

## 2023-09-11 NOTE — Anesthesia Procedure Notes (Signed)
 Procedure Name: Intubation Date/Time: 09/11/2023 8:21 AM  Performed by: Venia Carbon, CRNAPre-anesthesia Checklist: Patient identified, Emergency Drugs available, Suction available, Patient being monitored and Timeout performed Patient Re-evaluated:Patient Re-evaluated prior to induction Oxygen Delivery Method: Circle system utilized Preoxygenation: Pre-oxygenation with 100% oxygen Induction Type: IV induction Ventilation: Mask ventilation without difficulty Laryngoscope Size: Mac and 3 Grade View: Grade I Tube type: Oral Tube size: 7.0 mm Number of attempts: 1 Airway Equipment and Method: Stylet and Patient positioned with wedge pillow Placement Confirmation: ETT inserted through vocal cords under direct vision, positive ETCO2, CO2 detector and breath sounds checked- equal and bilateral Secured at: 22 cm Tube secured with: Tape

## 2023-09-11 NOTE — Anesthesia Postprocedure Evaluation (Signed)
 Anesthesia Post Note  Patient: Erin Henry  Procedure(s) Performed: ERCP, WITH INTERVENTION IF INDICATED SPHINCTEROTOMY, Biliary ERCP, with REMOVAL OF STONES     Patient location during evaluation: PACU Anesthesia Type: General Level of consciousness: sedated and patient cooperative Pain management: pain level controlled Vital Signs Assessment: post-procedure vital signs reviewed and stable Respiratory status: spontaneous breathing Cardiovascular status: stable Anesthetic complications: no   No notable events documented.  Last Vitals:  Vitals:   09/11/23 0930 09/11/23 1542  BP: 121/80 107/66  Pulse: 67 66  Resp: 17 16  Temp: 36.4 C 37 C  SpO2: 95% 97%    Last Pain:  Vitals:   09/11/23 1711  TempSrc:   PainSc: 4                  Lewie Loron

## 2023-09-11 NOTE — Op Note (Signed)
 [facility] Patient Name: Erin Henry Procedure Date : 09/11/2023 MRN: [id number] Attending MD: Jeani Hawking , MD, [id number] Date of Birth: [birth year] CSN: [id number] Age: 38 Admit Type: Inpatient Procedure:                ERCP Indications:              Common bile duct stone(s) Providers:                Jeani Hawking, MD, Eliberto Ivory, RN, Kandice Robinsons,                            Technician Referring MD:              Medicines:                General Anesthesia Complications:            No immediate complications. Estimated Blood Loss:     Estimated blood loss: none. Procedure:                Pre-Anesthesia Assessment:                           - Prior to the procedure, a History and Physical                            was performed, and patient medications and                            allergies were reviewed. The patient's tolerance of                            previous anesthesia was also reviewed. The risks                            and benefits of the procedure and the sedation                            options and risks were discussed with the patient.                            All questions were answered, and informed consent                            was obtained. Prior Anticoagulants: The patient has                            taken no anticoagulant or antiplatelet agents. ASA                            Grade Assessment: II - A patient with mild systemic                            disease. After reviewing the risks and benefits,  the patient was deemed in satisfactory condition to                            undergo the procedure.                           - Sedation was administered by an anesthesia                            professional. General anesthesia was attained.                           After obtaining informed consent, the scope was                            passed under direct vision. Throughout the                             procedure, the patient's blood pressure, pulse, and                            oxygen saturations were monitored continuously. The                            [id number]-Q190V ([id number] Olympus duodenoscope was                            introduced through the mouth, and used to inject                            contrast into and used to inject contrast into the                            bile, dorsal and ventral pancreatic ducts. The ERCP                            was technically difficult and complex. The patient                            tolerated the procedure well. Scope In: Scope Out: Findings:      The major papilla was normal. The bile duct was deeply cannulated with       the short-nosed traction sphincterotome. Contrast was injected. I       personally interpreted the bile duct images. There was brisk flow of       contrast through the ducts. Image quality was adequate. Contrast       extended to the bifurcation. The common bile duct was mildly dilated and       diffusely dilated, acquired. The largest diameter was 9 mm. A       cholecystectomy had been performed. A short 0.035 inch Soft Jagwire was       passed into the biliary tree. A 12 mm biliary sphincterotomy was made       with a monofilament traction (standard) sphincterotome using ERBE       electrocautery. There was  no post-sphincterotomy bleeding. The biliary       tree was swept with a 12 mm balloon starting at the bifurcation. Two       stones were removed. No stones remained.      The ampulla was spontaneously draining bile. Cannulation of the CBD       required the two wire technique after several failed attempts. With the       two wire technique the CBD was easily cannulated and the guidewire was       secured in the right intrahepatic ducts. Contrast injection revealed a       mildly dilated CBD at around 9 mm. There was no evidence of any cystic       duct leak at the surgical clips. A 12 mm  sphincterotomy was created and       the CBD was swept several times. Two stones, as noted on the       cholangiogram, were easily removed. A couple of more sweeps were       performed and no further stones were removed. The final occlusion       cholangiogram was negative for any retained stones. Impression:               - The major papilla appeared normal.                           - The common bile duct was mildly dilated, acquired.                           - The patient has had a cholecystectomy.                           - Choledocholithiasis was found. Complete removal                            was accomplished by biliary sphincterotomy and                            balloon extraction.                           - A biliary sphincterotomy was performed.                           - The biliary tree was swept. Recommendation:           - Return patient to hospital ward for ongoing care.                           - Resume regular diet.                           - Routine post-op care per Surgery. Procedure Code(s):        --- Professional ---                           [phone], Endoscopic retrograde  cholangiopancreatography (ERCP); with removal of                            calculi/debris from biliary/pancreatic duct(s)                           43262, Endoscopic retrograde                            cholangiopancreatography (ERCP); with                            sphincterotomy/papillotomy                           [phone], Endoscopic catheterization of the biliary                            ductal system, radiological supervision and                            interpretation Diagnosis Code(s):        --- Professional ---                           K80.50, Calculus of bile duct without cholangitis                            or cholecystitis without obstruction                           Z90.49, Acquired absence of other specified parts                             of digestive tract                           K83.8, Other specified diseases of biliary tract CPT copyright 2022 American Medical Association. All rights reserved. The codes documented in this report are preliminary and upon coder review may  be revised to meet current compliance requirements. Jeani Hawking, MD Jeani Hawking, MD 09/11/2023 9:00:41 AM This report has been signed electronically. Number of Addenda: 0

## 2023-09-11 NOTE — Plan of Care (Signed)

## 2023-09-12 LAB — URINALYSIS, ROUTINE W REFLEX MICROSCOPIC
Bilirubin Urine: NEGATIVE
Glucose, UA: NEGATIVE mg/dL
Hgb urine dipstick: NEGATIVE
Ketones, ur: NEGATIVE mg/dL
Nitrite: NEGATIVE
Protein, ur: NEGATIVE mg/dL
Specific Gravity, Urine: 1.018 (ref 1.005–1.030)
pH: 6 (ref 5.0–8.0)

## 2023-09-12 LAB — COMPREHENSIVE METABOLIC PANEL WITH GFR
ALT: 219 U/L — ABNORMAL HIGH (ref 0–44)
AST: 48 U/L — ABNORMAL HIGH (ref 15–41)
Albumin: 3.1 g/dL — ABNORMAL LOW (ref 3.5–5.0)
Alkaline Phosphatase: 111 U/L (ref 38–126)
Anion gap: 8 (ref 5–15)
BUN: 14 mg/dL (ref 6–20)
CO2: 29 mmol/L (ref 22–32)
Calcium: 9.2 mg/dL (ref 8.9–10.3)
Chloride: 102 mmol/L (ref 98–111)
Creatinine, Ser: 0.81 mg/dL (ref 0.44–1.00)
GFR, Estimated: >60 mL/min
Glucose, Bld: 136 mg/dL — ABNORMAL HIGH (ref 70–99)
Potassium: 3.9 mmol/L (ref 3.5–5.1)
Sodium: 139 mmol/L (ref 135–145)
Total Bilirubin: 0.4 mg/dL (ref 0.0–1.2)
Total Protein: 6.5 g/dL (ref 6.5–8.1)

## 2023-09-12 MED ORDER — ACETAMINOPHEN 500 MG PO TABS: 1000.0000 mg | ORAL_TABLET | Freq: Four times a day (QID) | ORAL | Status: AC | PRN

## 2023-09-12 MED ORDER — OXYCODONE HCL 5 MG PO TABS: 5.0000 mg | ORAL_TABLET | ORAL | 0 refills | Status: AC | PRN

## 2023-09-12 NOTE — Progress Notes (Signed)
 Utilize stratus interpreter, Research officer, trade union,  [employer]-ID#[id number] to communicate with pt to review AVS and answer all questions. Explain to pt that she does not have to pick up the prescription for oxycodone per Barnetta Chapel, PA-C since she doesn't like the way she feels when she takes the medication. Also, explained to pt how to alternate between tylenol and ibuprofen for pain as needed. Reviewed the medication log with pt to track dosage so she doesn't exceed the recommended dosage. Pt received work note.  Per the interpreter pt understands AVS instructions in its entirety and does not have any further questions.

## 2023-09-12 NOTE — Discharge Summary (Signed)
    Patient ID: Erin Henry [id number] January 16, 1986 38 y.o.  Admit date: 09/09/2023 Discharge date: 09/12/2023  Admitting Diagnosis: cholecystitis  Discharge Diagnosis Patient Active Problem List   Diagnosis Date Noted   Choledocholithiasis 09/10/2023   Cholecystitis, acute with cholelithiasis 09/09/2023    Consultants GI  Reason for Admission: 38 y.o. female with no significant PMH who presented to [facility] ED with abdominal pain which began last night around 10 pm. She had recently eaten dinner at 8 pm - rice soup. Pain located in RUQ with radiation to her back and she has had associated n/v. She has no prior episodes of similar symptoms. She tried drinking some hot tea to see if this would alleviate her pain, but to no avail.  Because of persistent pain, she presented to the ED where her labs were unremarkable, except mild leukocytosis of 12.8 and an [place] that reveals multiple gallstones with one measuring 1.7cm and wall thickening of 3mm.  Her LFTs are normal but her CBD is 10-77mm with some intrahepatic ductal dilatation.  We have been asked to see.   At time of my exam she is starting to have worsening pain and asking for pain medications. She is accompanied by her son.   Procedures Lap chole with + IOC, Dr. Hillery Hunter 09/09/23 ERCP, Dr. Elnoria Howard, 09/11/23  Hospital Course:  The patient was admitted and underwent  a lap chole with + IOC.  GI was consulted and performed an ERCP with stone extraction.  The patient tolerated these procedures well.  On POD 3 from lap chole, the patient was tolerating a diet, pain well controlled, voiding, and mobilizing.  She was stable for DC home.  Physical Exam: Abd: soft, appropriately tender, +BS, incisions c/d/i  Allergies as of 09/12/2023   No Known Allergies      Medication List     STOP taking these medications    multivitamin-prenatal 27-0.8 MG Tabs tablet       TAKE these medications    acetaminophen 500 MG  tablet Commonly known as: TYLENOL Take 2 tablets (1,000 mg total) by mouth every 6 (six) hours as needed.   ibuprofen 200 MG tablet Commonly known as: ADVIL Take 400 mg by mouth every 6 (six) hours as needed for headache, fever, moderate pain (pain score 4-6), mild pain (pain score 1-3) or cramping.   OMEPRAZOLE PO Take 2 tablets by mouth as needed.   oxyCODONE 5 MG immediate release tablet Commonly known as: Oxy IR/ROXICODONE Take 1 tablet (5 mg total) by mouth every 4 (four) hours as needed.          Follow-up Information     Maczis, Hedda Slade, PA-C Follow up in 3 week(s).   Specialty: General Surgery Why: Office will call you with a follow up appointment, If you don't hear from the office, please call, Arrive 30 minutes prior to your appointment time, Please bring your insurance card and photo ID Contact information: [address] [place] [address] [facility] [place] [facility] [place] [address] [phone]         Clayborne Dana, NP Follow up.   Specialties: Family Medicine, Emergency Medicine Why: October 11, 2023 1:20 pm Contact information: [address] [place] [address] [phone]                 Signed: Barnetta Chapel, [facility] 09/12/2023, 10:35 AM Please see Amion for pager number during day hours 7:00am-4:30pm, 7-11:30am on Weekends

## 2023-09-12 NOTE — Progress Notes (Signed)
 Utilized stratus interpreter, Spanish interpreter, Norva Pavlov [phone], assists with communicating with pt to perform assessments, medication administration. Pt complained of muscle spasms in abdomen, gave prn robaxin. Confirm with pt that she does not want any other medication for pain but tylenol and robaxin for muscle spasm. She refuses oxy because she does not like the way it makes her feel. Pt asked about being d'c today. I inform her that I don't have any orders for d'c but if that changes I will let her know. Pt said, "OK." Pt said she is OK and doesn't need anything at the moment. Pt instructed to call if she needs assistance.

## 2023-09-12 NOTE — Progress Notes (Signed)
 Erin Henry to be D/C'd  per MD order.  Discussed with the patient and all questions fully answered.  VSS, Skin clean, dry and intact without evidence of skin break down, no evidence of skin tears noted.  IV catheter discontinued intact. Site without signs and symptoms of complications. Dressing and pressure applied.  An After Visit Summary was printed and given to the patient. Patient to pick up prescriptions at preferred pharmacy except the oxycodone she refuses to take due to side effects.  D/c education completed with patient/family including follow up instructions, medication list, d/c activities limitations if indicated, with other d/c instructions as indicated by MD - patient able to verbalize understanding, all questions fully answered. Pt received return to work note provided by Barnetta Chapel, PA-C.  Patient instructed to return to ED, call 911, or call MD for any changes in condition.   Patient to be escorted via WC, and D/C home via private auto.

## 2023-09-12 NOTE — Plan of Care (Signed)

## 2023-09-12 NOTE — Plan of Care (Signed)

## 2023-09-13 ENCOUNTER — Encounter (HOSPITAL_COMMUNITY): Payer: Self-pay | Admitting: Gastroenterology

## 2023-09-13 LAB — SURGICAL PATHOLOGY

## 2023-10-11 ENCOUNTER — Ambulatory Visit: Payer: Self-pay | Admitting: Family Medicine
